# Patient Record
Sex: Female | Born: 1985 | Race: Black or African American | Hispanic: No | Marital: Married | Smoking: Former smoker
Health system: Southern US, Community
[De-identification: ages and names within clinical notes are randomized; demographics above are authoritative.]

## PROBLEM LIST (undated history)

## (undated) ENCOUNTER — Inpatient Hospital Stay (HOSPITAL_COMMUNITY): Payer: Self-pay

## (undated) ENCOUNTER — Inpatient Hospital Stay (HOSPITAL_COMMUNITY): Payer: MEDICAID

## (undated) DIAGNOSIS — Z789 Other specified health status: Secondary | ICD-10-CM

---

## 2012-06-01 ENCOUNTER — Inpatient Hospital Stay (HOSPITAL_COMMUNITY)
Admission: AD | Admit: 2012-06-01 | Discharge: 2012-06-01 | Disposition: A | Discharge status: Home / Self Care | Payer: Medicaid Other | Source: Ambulatory Visit | Attending: Obstetrics & Gynecology | Admitting: Obstetrics & Gynecology

## 2012-06-01 DIAGNOSIS — M545 Low back pain, unspecified: Secondary | ICD-10-CM | POA: Insufficient documentation

## 2012-06-01 DIAGNOSIS — O99891 Other specified diseases and conditions complicating pregnancy: Secondary | ICD-10-CM | POA: Insufficient documentation

## 2012-06-01 DIAGNOSIS — K3189 Other diseases of stomach and duodenum: Secondary | ICD-10-CM | POA: Insufficient documentation

## 2012-06-01 DIAGNOSIS — R1084 Generalized abdominal pain: Secondary | ICD-10-CM

## 2012-06-01 DIAGNOSIS — Z3201 Encounter for pregnancy test, result positive: Secondary | ICD-10-CM

## 2012-06-01 DIAGNOSIS — R1013 Epigastric pain: Secondary | ICD-10-CM | POA: Insufficient documentation

## 2012-06-01 DIAGNOSIS — Z331 Pregnant state, incidental: Secondary | ICD-10-CM

## 2012-06-01 DIAGNOSIS — R109 Unspecified abdominal pain: Secondary | ICD-10-CM | POA: Insufficient documentation

## 2012-06-01 LAB — URINE MICROSCOPIC-ADD ON

## 2012-06-01 LAB — URINALYSIS, ROUTINE W REFLEX MICROSCOPIC
Nitrite: NEGATIVE
Specific Gravity, Urine: 1.015 (ref 1.005–1.030)
Urobilinogen, UA: 0.2 mg/dL (ref 0.0–1.0)
pH: 8 (ref 5.0–8.0)

## 2012-06-01 LAB — POCT PREGNANCY, URINE: Preg Test, Ur: POSITIVE — AB

## 2012-06-01 LAB — WET PREP, GENITAL

## 2012-06-01 NOTE — MAU Note (Signed)
Pt reports having upper abd pain tha comes and goes x 3 weeks.repors back pain as well and headache. No N/V reported.

## 2012-06-01 NOTE — MAU Provider Note (Signed)
History     CSN: [id number]  Arrival date and time: 06/01/12 1113   None     Chief Complaint  Patient presents with  . Abdominal Pain   HPI Alyssa York is 26 y.o. No obstetric history on file. [redacted]w[redacted]d by LMP of 02/21/12 presenting with upper abdominal pain on and off X 1 week.   Reports more burping and gas than usual.  Denies nausea and vomiting.  Also having lower back pain.  Denies vaginal bleeding or abnormal vaginal bleeding. Just moved from [place] thought missed period was from stress.  1 sexual partner X 2 years.  Reports she is not using contraception but she didn't think of pregnancy.   She has an 75 month old at home.     No past medical history on file.  No past surgical history on file.  No family history on file.  History  Substance Use Topics  . Smoking status: Not on file  . Smokeless tobacco: Not on file  . Alcohol Use: Not on file    Allergies: Allergies not on file  No prescriptions prior to admission    Review of Systems  Constitutional: Negative for fever.  Cardiovascular: Claudication: off and on.    Gastrointestinal: Positive for heartburn and abdominal pain (upper). Negative for nausea and vomiting.  Genitourinary:       Negative for bleeding or discharge  Musculoskeletal: Positive for back pain.  Neurological: Positive for headaches.   Physical Exam   Blood pressure 132/65, pulse 81, temperature 98.8 F (37.1 C), temperature source Oral, resp. rate 18, height 5' (1.524 m), weight 74.934 kg (165 lb 3.2 oz), last menstrual period 02/21/2012.  Physical Exam  Constitutional: She is oriented to person, place, and time. She appears well-developed and well-nourished. No distress.  HENT:  Head: Normocephalic.  Neck: Normal range of motion.  Cardiovascular: Normal rate.   Respiratory: Effort normal.  GI: Soft. She exhibits no distension and no mass. There is no tenderness. There is no rebound and no guarding.  Genitourinary: Uterus is  enlarged (fundus 1 cm below umbilicus.  FHR 147). Cervix exhibits no discharge and no friability. No bleeding around the vagina. Vaginal discharge (moderate amount of white frothy discharge without odor) found.  Neurological: She is alert and oriented to person, place, and time.  Skin: Skin is warm and dry.  Psychiatric: She has a normal mood and affect. Her behavior is normal. Thought content normal.   Results for orders placed during the hospital encounter of 06/01/12 (from the past 24 hour(s))  URINALYSIS, ROUTINE W REFLEX MICROSCOPIC     Status: Abnormal   Collection Time   06/01/12 12:00 PM      Component Value Range   Color, Urine YELLOW  YELLOW   APPearance CLEAR  CLEAR   Specific Gravity, Urine 1.015  1.005 - 1.030   pH 8.0  5.0 - 8.0   Glucose, UA NEGATIVE  NEGATIVE mg/dL   Hgb urine dipstick NEGATIVE  NEGATIVE   Bilirubin Urine NEGATIVE  NEGATIVE   Ketones, ur NEGATIVE  NEGATIVE mg/dL   Protein, ur NEGATIVE  NEGATIVE mg/dL   Urobilinogen, UA 0.2  0.0 - 1.0 mg/dL   Nitrite NEGATIVE  NEGATIVE   Leukocytes, UA TRACE (*) NEGATIVE  URINE MICROSCOPIC-ADD ON     Status: Abnormal   Collection Time   06/01/12 12:00 PM      Component Value Range   Squamous Epithelial / LPF FEW (*) RARE   WBC, UA 3-6  <  3 WBC/hpf   RBC / HPF 0-2  <3 RBC/hpf   Bacteria, UA FEW (*) RARE  POCT PREGNANCY, URINE     Status: Abnormal   Collection Time   06/01/12 12:05 PM      Component Value Range   Preg Test, Ur POSITIVE (*) NEGATIVE  WET PREP, GENITAL     Status: Abnormal   Collection Time   06/01/12  2:00 PM      Component Value Range   Yeast Wet Prep HPF POC NONE SEEN  NONE SEEN   Trich, Wet Prep NONE SEEN  NONE SEEN   Clue Cells Wet Prep HPF POC FEW (*) NONE SEEN   WBC, Wet Prep HPF POC FEW (*) NONE SEEN   MAU Course  Procedures  MDM   Assessment and Plan  A:  Indigestion at [redacted] weeks gestation     Lower back pain with negative UA   P:  Patient states she has to leave before labs back  because her husband has to go to work.  [facility]      I will call her with Wet prep results at [phone].   No Rx needed.  Patient informed of lab results     Instructed to begin prenatal vitamins OTC daily and begin prenatal care.  May take tums for indigestion and tylenol for back pain as needed. KEY,EVE M 06/01/2012, 1:36 PM

## 2012-06-02 LAB — GC/CHLAMYDIA PROBE AMP, GENITAL
Chlamydia, DNA Probe: NEGATIVE
GC Probe Amp, Genital: NEGATIVE

## 2012-06-02 NOTE — MAU Provider Note (Signed)
Attestation of Attending Supervision of Advanced Practitioner (CNM/NP): Evaluation and management procedures were performed by the Advanced Practitioner under my supervision and collaboration.  I have reviewed the Advanced Practitioner's note and chart, and I agree with the management and plan.  Kateryn Marasigan, M.D. 06/02/2012 8:05 AM  

## 2012-10-02 ENCOUNTER — Inpatient Hospital Stay (HOSPITAL_COMMUNITY)
Admission: AD | Admit: 2012-10-02 | Payer: Medicaid Other | Source: Ambulatory Visit | Admitting: Obstetrics and Gynecology

## 2013-11-26 NOTE — L&D Delivery Note (Signed)
Delivery Note At 9:01 AM a viable female was delivered via Vaginal, Spontaneous Delivery (Presentation: ; Occiput Posterior).  APGAR: 9, 9; weight TBD.   Placenta status: Intact, Spontaneous.  Cord: 3 vessels with the following complications: None.    Anesthesia: Epidural  Episiotomy: None Lacerations: None Suture Repair: na Est. Blood Loss (mL): 200  Mom to postpartum.  Baby to Couplet care / Skin to Skin.  Pt precipitously delivered without pushing a liveborn female via NSVD with spontaneous cry from direct OP position.   Baby placed on maternal abdomen. I arrived immediately after.  Delayed cord clamping performed.  Cord cut by FOB.  Placenta delivered intact with 3V cord via traction and pitocin.  no tears. No complications.  Mom and baby to postpartum.   Majesty Stehlin L 05/27/2014, 9:22 AM

## 2014-03-11 ENCOUNTER — Inpatient Hospital Stay (HOSPITAL_COMMUNITY)
Admission: AD | Admit: 2014-03-11 | Discharge: 2014-03-11 | Disposition: A | Discharge status: Home / Self Care | Payer: Medicaid Other | Source: Ambulatory Visit | Attending: Obstetrics & Gynecology | Admitting: Obstetrics & Gynecology

## 2014-03-11 ENCOUNTER — Encounter (HOSPITAL_COMMUNITY): Payer: Self-pay | Admitting: *Deleted

## 2014-03-11 DIAGNOSIS — R109 Unspecified abdominal pain: Secondary | ICD-10-CM | POA: Insufficient documentation

## 2014-03-11 DIAGNOSIS — O0933 Supervision of pregnancy with insufficient antenatal care, third trimester: Secondary | ICD-10-CM

## 2014-03-11 DIAGNOSIS — Z87891 Personal history of nicotine dependence: Secondary | ICD-10-CM | POA: Insufficient documentation

## 2014-03-11 DIAGNOSIS — O34219 Maternal care for unspecified type scar from previous cesarean delivery: Secondary | ICD-10-CM | POA: Diagnosis present

## 2014-03-11 DIAGNOSIS — O9989 Other specified diseases and conditions complicating pregnancy, childbirth and the puerperium: Principal | ICD-10-CM

## 2014-03-11 DIAGNOSIS — O99891 Other specified diseases and conditions complicating pregnancy: Secondary | ICD-10-CM | POA: Insufficient documentation

## 2014-03-11 DIAGNOSIS — O0930 Supervision of pregnancy with insufficient antenatal care, unspecified trimester: Secondary | ICD-10-CM | POA: Insufficient documentation

## 2014-03-11 HISTORY — DX: Other specified health status: Z78.9

## 2014-03-11 LAB — URINALYSIS, ROUTINE W REFLEX MICROSCOPIC
BILIRUBIN URINE: NEGATIVE
GLUCOSE, UA: NEGATIVE mg/dL
KETONES UR: NEGATIVE mg/dL
LEUKOCYTES UA: NEGATIVE
Nitrite: NEGATIVE
PH: 7 (ref 5.0–8.0)
Protein, ur: NEGATIVE mg/dL
Specific Gravity, Urine: 1.025 (ref 1.005–1.030)
Urobilinogen, UA: 2 mg/dL — ABNORMAL HIGH (ref 0.0–1.0)

## 2014-03-11 LAB — URINE MICROSCOPIC-ADD ON

## 2014-03-11 NOTE — MAU Provider Note (Signed)
Obstetric Attending MAU Note  Chief Complaint:  Abdominal Pain   HPI: Alyssa York is a 28 y.o. G3P2002 at 226w0d who presents to maternity admissions reporting abdominal pain/contractions last week, no current symptoms.  Has not started prenatal care yet, wants an ultrasound to see her baby.  Denies contractions, leakage of fluid or vaginal bleeding. Good fetal movement.    Patient Active Problem List   Diagnosis Date Noted  . Late prenatal care complicating pregnancy in third trimester 03/11/2014  . Previous cesarean section complicating pregnancy, antepartum 03/11/2014    Past Medical History  Diagnosis Date  . Medical history non-contributory     OB History  Gravida Para Term Preterm AB SAB TAB Ectopic Multiple Living  3 2 2       2     # Outcome Date GA Lbr Len/2nd Weight Sex Delivery Anes PTL Lv  3 CUR           2 TRM           1 TRM               Past Surgical History  Procedure Laterality Date  . Cesarean section      Family History: No family history on file.  Social History: History  Substance Use Topics  . Smoking status: Former Smoker    Quit date: 03/11/2010  . Smokeless tobacco: Not on file  . Alcohol Use: No    Allergies: No Known Allergies  No prescriptions prior to admission    ROS: Pertinent findings in history of present illness.  Physical Exam  Last menstrual period 09/03/2013. GENERAL: Well-developed, well-nourished female in no acute distress.  ABDOMEN: Soft, non-tender, gravid appropriate for gestational age, Fundal height 25 cm EXTREMITIES: Nontender, no edema NEURO: Alert and oriented  FHT:  Baseline 150 , moderate variability, 10 x 10 accelerations present, no decelerations Contractions: none   Labs: Results for orders placed during the hospital encounter of 03/11/14 (from the past 24 hour(s))  URINALYSIS, ROUTINE W REFLEX MICROSCOPIC     Status: Abnormal   Collection Time    03/11/14  1:19 PM      Result Value Ref Range    Color, Urine YELLOW  YELLOW   APPearance CLEAR  CLEAR   Specific Gravity, Urine 1.025  1.005 - 1.030   pH 7.0  5.0 - 8.0   Glucose, UA NEGATIVE  NEGATIVE mg/dL   Hgb urine dipstick TRACE (*) NEGATIVE   Bilirubin Urine NEGATIVE  NEGATIVE   Ketones, ur NEGATIVE  NEGATIVE mg/dL   Protein, ur NEGATIVE  NEGATIVE mg/dL   Urobilinogen, UA 2.0 (*) 0.0 - 1.0 mg/dL   Nitrite NEGATIVE  NEGATIVE   Leukocytes, UA NEGATIVE  NEGATIVE  URINE MICROSCOPIC-ADD ON     Status: Abnormal   Collection Time    03/11/14  1:19 PM      Result Value Ref Range   Squamous Epithelial / LPF MANY (*) RARE   WBC, UA 0-2  <3 WBC/hpf   RBC / HPF 3-6  <3 RBC/hpf   Bacteria, UA MANY (*) RARE   Assessment: 1. Late prenatal care complicating pregnancy in third trimester     Plan: Discharge home Labor precautions and fetal kick counts reviewed Follow up for anatomy ultrasound and OB visit as below      Follow-up Information   [facility]2014. (10 am for New OB appointment, prenatal labs and 1 hr GTT)    Contact  information:   [address] [place] [address] [phone]      [facility] On 03/19/2014. (10:15 am for full ultrasound of the baby. )    Specialty:  Radiology   Contact information:   [address] [id number] [place] [id number] [phone]        Medication List    Notice   You have not been prescribed any medications.      Tereso NewcomerUgonna A Harace Mccluney, MD 03/11/2014 2:39 PM

## 2014-03-11 NOTE — Discharge Instructions (Signed)

## 2014-03-11 NOTE — MAU Note (Signed)
Sharp pains and cramping in lower abd for "wks".. No care yet, waiting on medicaid.  Has not been seen yet.

## 2014-03-11 NOTE — MAU Note (Signed)
pt reports she has cramping pain in the morning and in the evenings but none right now. Has not stared prenatal care yet.

## 2014-03-19 ENCOUNTER — Ambulatory Visit (HOSPITAL_COMMUNITY)
Admit: 2014-03-19 | Discharge: 2014-03-19 | Disposition: A | Discharge status: Home / Self Care | Payer: Medicaid Other | Attending: Obstetrics & Gynecology | Admitting: Obstetrics & Gynecology

## 2014-03-19 ENCOUNTER — Other Ambulatory Visit: Payer: Self-pay | Admitting: Obstetrics & Gynecology

## 2014-03-19 DIAGNOSIS — Z3689 Encounter for other specified antenatal screening: Secondary | ICD-10-CM | POA: Insufficient documentation

## 2014-03-19 DIAGNOSIS — Z3490 Encounter for supervision of normal pregnancy, unspecified, unspecified trimester: Secondary | ICD-10-CM

## 2014-03-20 ENCOUNTER — Encounter: Payer: Self-pay | Admitting: Obstetrics & Gynecology

## 2014-03-25 ENCOUNTER — Encounter: Payer: Medicaid Other | Admitting: Obstetrics & Gynecology

## 2014-03-25 ENCOUNTER — Encounter: Payer: Self-pay | Admitting: Obstetrics & Gynecology

## 2014-05-11 ENCOUNTER — Encounter: Payer: Self-pay | Admitting: General Practice

## 2014-05-22 ENCOUNTER — Encounter: Payer: Self-pay | Admitting: Women's Health

## 2014-05-22 ENCOUNTER — Encounter (HOSPITAL_COMMUNITY): Payer: Self-pay | Admitting: Women's Health

## 2014-05-22 ENCOUNTER — Inpatient Hospital Stay (HOSPITAL_COMMUNITY)
Admission: AD | Admit: 2014-05-22 | Discharge: 2014-05-22 | Disposition: A | Discharge status: Home / Self Care | Payer: 59 | Source: Ambulatory Visit | Attending: Obstetrics & Gynecology | Admitting: Obstetrics & Gynecology

## 2014-05-22 DIAGNOSIS — W010XXA Fall on same level from slipping, tripping and stumbling without subsequent striking against object, initial encounter: Secondary | ICD-10-CM | POA: Insufficient documentation

## 2014-05-22 DIAGNOSIS — N949 Unspecified condition associated with female genital organs and menstrual cycle: Secondary | ICD-10-CM | POA: Insufficient documentation

## 2014-05-22 DIAGNOSIS — O34219 Maternal care for unspecified type scar from previous cesarean delivery: Secondary | ICD-10-CM

## 2014-05-22 DIAGNOSIS — M7918 Myalgia, other site: Secondary | ICD-10-CM

## 2014-05-22 DIAGNOSIS — Z87891 Personal history of nicotine dependence: Secondary | ICD-10-CM | POA: Insufficient documentation

## 2014-05-22 DIAGNOSIS — O99891 Other specified diseases and conditions complicating pregnancy: Secondary | ICD-10-CM | POA: Insufficient documentation

## 2014-05-22 DIAGNOSIS — O9989 Other specified diseases and conditions complicating pregnancy, childbirth and the puerperium: Principal | ICD-10-CM

## 2014-05-22 DIAGNOSIS — O0933 Supervision of pregnancy with insufficient antenatal care, third trimester: Secondary | ICD-10-CM

## 2014-05-22 DIAGNOSIS — Y92009 Unspecified place in unspecified non-institutional (private) residence as the place of occurrence of the external cause: Secondary | ICD-10-CM | POA: Insufficient documentation

## 2014-05-22 LAB — RAPID URINE DRUG SCREEN, HOSP PERFORMED
Amphetamines: NOT DETECTED
BARBITURATES: NOT DETECTED
Benzodiazepines: NOT DETECTED
COCAINE: NOT DETECTED
Opiates: NOT DETECTED
Tetrahydrocannabinol: NOT DETECTED

## 2014-05-22 LAB — URINALYSIS, ROUTINE W REFLEX MICROSCOPIC
BILIRUBIN URINE: NEGATIVE
Glucose, UA: NEGATIVE mg/dL
Hgb urine dipstick: NEGATIVE
KETONES UR: 40 mg/dL — AB
Leukocytes, UA: NEGATIVE
NITRITE: NEGATIVE
Protein, ur: NEGATIVE mg/dL
SPECIFIC GRAVITY, URINE: 1.015 (ref 1.005–1.030)
UROBILINOGEN UA: 2 mg/dL — AB (ref 0.0–1.0)
pH: 7 (ref 5.0–8.0)

## 2014-05-22 LAB — OB RESULTS CONSOLE GC/CHLAMYDIA
CHLAMYDIA, DNA PROBE: NEGATIVE
GC PROBE AMP, GENITAL: NEGATIVE

## 2014-05-22 LAB — DIFFERENTIAL
Basophils Absolute: 0 10*3/uL (ref 0.0–0.1)
Basophils Relative: 0 % (ref 0–1)
Eosinophils Absolute: 0 10*3/uL (ref 0.0–0.7)
Eosinophils Relative: 0 % (ref 0–5)
LYMPHS ABS: 1.1 10*3/uL (ref 0.7–4.0)
LYMPHS PCT: 15 % (ref 12–46)
Monocytes Absolute: 0.7 10*3/uL (ref 0.1–1.0)
Monocytes Relative: 9 % (ref 3–12)
NEUTROS ABS: 5.5 10*3/uL (ref 1.7–7.7)
NEUTROS PCT: 76 % (ref 43–77)

## 2014-05-22 LAB — RAPID HIV SCREEN (WH-MAU): SUDS RAPID HIV SCREEN: NONREACTIVE

## 2014-05-22 LAB — TYPE AND SCREEN
ABO/RH(D): B POS
ANTIBODY SCREEN: NEGATIVE

## 2014-05-22 LAB — WET PREP, GENITAL
TRICH WET PREP: NONE SEEN
YEAST WET PREP: NONE SEEN

## 2014-05-22 LAB — CBC
HCT: 34.7 % — ABNORMAL LOW (ref 36.0–46.0)
Hemoglobin: 11.7 g/dL — ABNORMAL LOW (ref 12.0–15.0)
MCH: 29 pg (ref 26.0–34.0)
MCHC: 33.7 g/dL (ref 30.0–36.0)
MCV: 86.1 fL (ref 78.0–100.0)
PLATELETS: 195 10*3/uL (ref 150–400)
RBC: 4.03 MIL/uL (ref 3.87–5.11)
RDW: 13.3 % (ref 11.5–15.5)
WBC: 7.4 10*3/uL (ref 4.0–10.5)

## 2014-05-22 LAB — GROUP B STREP BY PCR: GROUP B STREP BY PCR: POSITIVE — AB

## 2014-05-22 LAB — HEPATITIS B SURFACE ANTIGEN: Hepatitis B Surface Ag: NEGATIVE

## 2014-05-22 LAB — ABO/RH: ABO/RH(D): B POS

## 2014-05-22 LAB — RPR

## 2014-05-22 LAB — GLUCOSE TOLERANCE, 1 HOUR: GLUCOSE 1 HOUR GTT: 122 mg/dL (ref 70–140)

## 2014-05-22 MED ORDER — ACETAMINOPHEN 325 MG PO TABS
650.0000 mg | ORAL_TABLET | ORAL | Status: DC | PRN
  Administered 2014-05-22: 650 mg via ORAL
  Filled 2014-05-22: qty 2

## 2014-05-22 NOTE — MAU Provider Note (Signed)

## 2014-05-22 NOTE — MAU Note (Signed)
Pt states here for pelvic pain. No PNC with exception of [facility]. Partner states pt was in kitchen and slipped, did not hit her abdomen, however has had increased pain in pelvis and has had contractions since then.

## 2014-05-22 NOTE — MAU Provider Note (Signed)
History  Chief Complaint:  Pelvic Pain  Alyssa York is a 28 y.o. G61P2002 female at 108w2d by LMP c/w 27.5wk u/s, presenting w/ report of slipping on water in kitchen last night and falling w/ legs spread, hitting mons area of pelvis and reporting pain in that area now.  Has not tried anything for pain relief. Has not had any pnc, had visit here in April- was scheduled for outpatient anatomy u/s which was completed and revealed normal female w/ dating c/w LMP. She did not show up for new ob appt. States she never applied for her [place].  Reports active fetal movement, contractions: irregular for 'awhile', vaginal bleeding: none, membranes: intact. Denies uti s/s, abnormal/malodorous vag d/c or vulvovaginal itching/irritation.   Pregnancy complicated by no pnc, h/o c/s w/ 1st pregnancy d/t nrfhr, had successful vbac w/ 2nd pregnancy, desires vbac.  Obstetrical History: OB History   Grav Para Term Preterm Abortions TAB SAB Ect Mult Living   3 2 2       2       Past Medical History: Past Medical History  Diagnosis Date  . Medical history non-contributory     Past Surgical History: Past Surgical History  Procedure Laterality Date  . Cesarean section      Social History: History   Social History  . Marital Status: Married    Spouse Name: N/A    Number of Children: N/A  . Years of Education: N/A   Social History Main Topics  . Smoking status: Former Smoker    Quit date: 03/11/2010  . Smokeless tobacco: None  . Alcohol Use: No  . Drug Use: No  . Sexual Activity: Yes   Other Topics Concern  . None   Social History Narrative  . None    Allergies: No Known Allergies  Prescriptions prior to admission  Medication Sig Dispense Refill  . Prenatal Vit-Fe Fumarate-FA (PRENATAL MULTIVITAMIN) TABS tablet Take 1 tablet by mouth daily at 12 noon.        Review of Systems  Pertinent pos/neg as indicated in HPI  Physical Exam  Blood pressure 99/61, pulse 106, temperature  98.2 F (36.8 C), resp. rate 18, height 5' (1.524 m), weight 79.379 kg (175 lb), last menstrual period 09/03/2013. General appearance: alert, cooperative and no distress Lungs: clear to auscultation bilaterally, normal effort Heart: regular rate and rhythm Abdomen: gravid, soft, non-tender Extremities: No edema DTR's 2+ + tenderness mons pubis/symphisis pubis- no obvious signs of trauma/bruising Spec exam: cx visually slightly open, small amt creamy white nonodorous d/c Cultures/Specimens: gc/ct, wet prep, gbs  Dilation: 2.5 Effacement (%): 50 Station: -2 Presentation: Vertex Exam by:: Joellyn Haff CNM Presentation: cephalic  Fetal monitoring: FHR: 125 bpm, variability: moderate,  Accelerations: Present,  decelerations:  Absent Uterine activity: ui  MAU Course  Spec exam w/ gc/ct, wet prep, gbs SVE OB panel w/ 1hr glucola UA, UDS, urine cx APAP  Labs:  Results for orders placed during the hospital encounter of 05/22/14 (from the past 24 hour(s))  URINALYSIS, ROUTINE W REFLEX MICROSCOPIC     Status: Abnormal   Collection Time    05/22/14  4:14 PM      Result Value Ref Range   Color, Urine YELLOW  YELLOW   APPearance CLEAR  CLEAR   Specific Gravity, Urine 1.015  1.005 - 1.030   pH 7.0  5.0 - 8.0   Glucose, UA NEGATIVE  NEGATIVE mg/dL   Hgb urine dipstick NEGATIVE  NEGATIVE   Bilirubin Urine NEGATIVE  NEGATIVE   Ketones, ur 40 (*) NEGATIVE mg/dL   Protein, ur NEGATIVE  NEGATIVE mg/dL   Urobilinogen, UA 2.0 (*) 0.0 - 1.0 mg/dL   Nitrite NEGATIVE  NEGATIVE   Leukocytes, UA NEGATIVE  NEGATIVE  URINE RAPID DRUG SCREEN (HOSP PERFORMED)     Status: None   Collection Time    05/22/14  4:14 PM      Result Value Ref Range   Opiates NONE DETECTED  NONE DETECTED   Cocaine NONE DETECTED  NONE DETECTED   Benzodiazepines NONE DETECTED  NONE DETECTED   Amphetamines NONE DETECTED  NONE DETECTED   Tetrahydrocannabinol NONE DETECTED  NONE DETECTED   Barbiturates NONE DETECTED  NONE  DETECTED  GROUP B STREP BY PCR     Status: Abnormal   Collection Time    05/22/14  4:45 PM      Result Value Ref Range   Group B strep by PCR POSITIVE (*) NEGATIVE  WET PREP, GENITAL     Status: Abnormal   Collection Time    05/22/14  4:45 PM      Result Value Ref Range   Yeast Wet Prep HPF POC NONE SEEN  NONE SEEN   Trich, Wet Prep NONE SEEN  NONE SEEN   Clue Cells Wet Prep HPF POC MODERATE (*) NONE SEEN   WBC, Wet Prep HPF POC FEW (*) NONE SEEN  CBC     Status: Abnormal   Collection Time    05/22/14  5:14 PM      Result Value Ref Range   WBC 7.4  4.0 - 10.5 K/uL   RBC 4.03  3.87 - 5.11 MIL/uL   Hemoglobin 11.7 (*) 12.0 - 15.0 g/dL   HCT 29.934.7 (*) 37.136.0 - 69.646.0 %   MCV 86.1  78.0 - 100.0 fL   MCH 29.0  26.0 - 34.0 pg   MCHC 33.7  30.0 - 36.0 g/dL   RDW 78.913.3  38.111.5 - 01.715.5 %   Platelets 195  150 - 400 K/uL  DIFFERENTIAL     Status: None   Collection Time    05/22/14  5:14 PM      Result Value Ref Range   Neutrophils Relative % 76  43 - 77 %   Neutro Abs 5.5  1.7 - 7.7 K/uL   Lymphocytes Relative 15  12 - 46 %   Lymphs Abs 1.1  0.7 - 4.0 K/uL   Monocytes Relative 9  3 - 12 %   Monocytes Absolute 0.7  0.1 - 1.0 K/uL   Eosinophils Relative 0  0 - 5 %   Eosinophils Absolute 0.0  0.0 - 0.7 K/uL   Basophils Relative 0  0 - 1 %   Basophils Absolute 0.0  0.0 - 0.1 K/uL  TYPE AND SCREEN     Status: None   Collection Time    05/22/14  5:14 PM      Result Value Ref Range   ABO/RH(D) B POS     Antibody Screen NEG     Sample Expiration 05/25/2014    RAPID HIV SCREEN ([facility])     Status: None   Collection Time    05/22/14  5:14 PM      Result Value Ref Range   SUDS Rapid HIV Screen NON REACTIVE  NON REACTIVE    Imaging:  n/a  Assessment and Plan  A:  2067w2d SIUP  G3P2002  Symphysis pubis/mons tenderness s/p fall   Cat 1 FHR  GBS+  Mod clues on wet prep, asymptomatic, will not tx  No pnc, ob panel, 1hr glucola, gbs, gc/ct, urine cx done at this visit  H/O prev c/s for  nrfhr w/ subsequent successful vbac, desires another vbac P:  D/C home  Reviewed labor s/s, fkc  APAP, warm baths, pregnancy/maternity belt for symphysis pubis/mons discomfort  VBAC consent signed, will scan into chart  Will send note to clinic to contact pt Monday for new ob visit   Marge DuncansBooker, Kimberly Randall CNM,WHNP-BC 6/27/20156:28 PM

## 2014-05-22 NOTE — Discharge Instructions (Signed)
[facility] if:  You begin to have strong, frequent contractions  Your water breaks.  Sometimes it is a big gush of fluid, sometimes it is just a trickle that keeps getting your panties wet or running down your legs  You have vaginal bleeding.  It is normal to have a small amount of spotting if your cervix was checked.   You don't feel your baby moving like normal.  If you don't, get you something to eat and drink and lay down and focus on feeling your baby move.  You should feel at least 10 movements in 2 hours.  If you don't, you should call the clinic or [facility].    [facility] Cont[facility] Contractions of the uterus can occur throughout pregnancy. Contractions are not always a sign that you are in labor.  WHAT ARE BRAXTON HICKS CONTRACTIONS?  Contractions that occur before labor are called Braxton Hicks contractions, or false labor. Toward the end of pregnancy (32-34 weeks), these contractions can develop more often and may become more forceful. This is not true labor because these contractions do not result in opening (dilatation) and thinning of the cervix. They are sometimes difficult to tell apart from true labor because these contractions can be forceful and people have different pain tolerances. You should not feel embarrassed if you go to the hospital with false labor. Sometimes, the only way to tell if you are in true labor is for your health care provider to look for changes in the cervix. If there are no prenatal problems or other health problems associated with the pregnancy, it is completely safe to be sent home with false labor and await the onset of true labor. HOW CAN YOU TELL THE DIFFERENCE BETWEEN TRUE AND FALSE LABOR? False Labor  The contractions of false labor are usually shorter and not as hard as those of true labor.   The contractions are usually irregular.   The contractions are often felt in the front of the lower abdomen and in the  groin.   The contractions may go away when you walk around or change positions while lying down.   The contractions get weaker and are shorter lasting as time goes on.   The contractions do not usually become progressively stronger, regular, and closer together as with true labor.  True Labor  Contractions in true labor last 30-70 seconds, become very regular, usually become more intense, and increase in frequency.   The contractions do not go away with walking.   The discomfort is usually felt in the top of the uterus and spreads to the lower abdomen and low back.   True labor can be determined by your health care provider with an exam. This will show that the cervix is dilating and getting thinner.  WHAT TO REMEMBER  Keep up with your usual exercises and follow other instructions given by your health care provider.   Take medicines as directed by your health care provider.   Keep your regular prenatal appointments.   Eat and drink lightly if you think you are going into labor.   If Braxton Hicks contractions are making you uncomfortable:   Change your position from lying down or resting to walking, or from walking to resting.   Sit and rest in a tub of warm water.   Drink 2-3 glasses of water. Dehydration may cause these contractions.   Do slow and deep breathing several times an hour.  WHEN SHOULD I SEEK  IMMEDIATE MEDICAL CARE? Seek immediate medical care if:  Your contractions become stronger, more regular, and closer together.   You have fluid leaking or gushing from your vagina.   You have a fever.   You pass blood-tinged mucus.   You have vaginal bleeding.   You have continuous abdominal pain.   You have low back pain that you never had before.   You feel your baby's head pushing down and causing pelvic pressure.   Your baby is not moving as much as it used to.  Document Released: 11/12/2005 Document Revised: 11/17/2013 Document  Reviewed: 08/24/2013 [facility] Patient Information 2015 [place]. This information is not intended to replace advice given to you by your health care provider. Make sure you discuss any questions you have with your health care provider. Fetal Movement Counts Patient Name: __________________________________________________ Patient Due Date: ____________________ Performing a fetal movement count is highly recommended in high-risk pregnancies, but it is good for every pregnant woman to do. Your caregiver may ask you to start counting fetal movements at 28 weeks of the pregnancy. Fetal movements often increase:  After eating a full meal.  After physical activity.  After eating or drinking something sweet or cold.  At rest. Pay attention to when you feel the baby is most active. This will help you notice a pattern of your baby's sleep and wake cycles and what factors contribute to an increase in fetal movement. It is important to perform a fetal movement count at the same time each day when your baby is normally most active.  HOW TO COUNT FETAL MOVEMENTS 1. Find a quiet and comfortable area to sit or lie down on your left side. Lying on your left side provides the best blood and oxygen circulation to your baby. 2. Write down the day and time on a sheet of paper or in a journal. 3. Start counting kicks, flutters, swishes, rolls, or jabs in a 2 hour period. You should feel at least 10 movements within 2 hours. 4. If you do not feel 10 movements in 2 hours, wait 2-3 hours and count again. Look for a change in the pattern or not enough counts in 2 hours. SEEK MEDICAL CARE IF:  You feel less than 10 counts in 2 hours, tried twice.  There is no movement in over an hour.  The pattern is changing or taking longer each day to reach 10 counts in 2 hours.  You feel the baby is not moving as he or she usually does. Date: ____________ Movements: ____________ Start time: ____________ Alyssa MartinFinish time:  ____________  Date: ____________ Movements: ____________ Start time: ____________ Alyssa MartinFinish time: ____________ Date: ____________ Movements: ____________ Start time: ____________ Alyssa MartinFinish time: ____________ Date: ____________ Movements: ____________ Start time: ____________ Alyssa MartinFinish time: ____________ Date: ____________ Movements: ____________ Start time: ____________ Alyssa MartinFinish time: ____________ Date: ____________ Movements: ____________ Start time: ____________ Alyssa MartinFinish time: ____________ Date: ____________ Movements: ____________ Start time: ____________ Alyssa MartinFinish time: ____________ Date: ____________ Movements: ____________ Start time: ____________ Alyssa MartinFinish time: ____________  Date: ____________ Movements: ____________ Start time: ____________ Alyssa MartinFinish time: ____________ Date: ____________ Movements: ____________ Start time: ____________ Alyssa MartinFinish time: ____________ Date: ____________ Movements: ____________ Start time: ____________ Alyssa MartinFinish time: ____________ Date: ____________ Movements: ____________ Start time: ____________ Alyssa MartinFinish time: ____________ Date: ____________ Movements: ____________ Start time: ____________ Alyssa MartinFinish time: ____________ Date: ____________ Movements: ____________ Start time: ____________ Alyssa MartinFinish time: ____________ Date: ____________ Movements: ____________ Start time: ____________ Alyssa MartinFinish time: ____________  Date: ____________ Movements: ____________ Start time: ____________ Alyssa MartinFinish time: ____________ Date: ____________ Movements: ____________  Start time: ____________ Alyssa York time: ____________ Date: ____________ Movements: ____________ Start time: ____________ Alyssa York time: ____________ Date: ____________ Movements: ____________ Start time: ____________ Alyssa York time: ____________ Date: ____________ Movements: ____________ Start time: ____________ Alyssa York time: ____________ Date: ____________ Movements: ____________ Start time: ____________ Alyssa York time: ____________ Date: ____________ Movements:  ____________ Start time: ____________ Alyssa York time: ____________  Date: ____________ Movements: ____________ Start time: ____________ Alyssa York time: ____________ Date: ____________ Movements: ____________ Start time: ____________ Alyssa York time: ____________ Date: ____________ Movements: ____________ Start time: ____________ Alyssa York time: ____________ Date: ____________ Movements: ____________ Start time: ____________ Alyssa York time: ____________ Date: ____________ Movements: ____________ Start time: ____________ Alyssa York time: ____________ Date: ____________ Movements: ____________ Start time: ____________ Alyssa York time: ____________ Date: ____________ Movements: ____________ Start time: ____________ Alyssa York time: ____________  Date: ____________ Movements: ____________ Start time: ____________ Alyssa York time: ____________ Date: ____________ Movements: ____________ Start time: ____________ Alyssa York time: ____________ Date: ____________ Movements: ____________ Start time: ____________ Alyssa York time: ____________ Date: ____________ Movements: ____________ Start time: ____________ Alyssa York time: ____________ Date: ____________ Movements: ____________ Start time: ____________ Alyssa York time: ____________ Date: ____________ Movements: ____________ Start time: ____________ Alyssa York time: ____________ Date: ____________ Movements: ____________ Start time: ____________ Alyssa York time: ____________  Date: ____________ Movements: ____________ Start time: ____________ Alyssa York time: ____________ Date: ____________ Movements: ____________ Start time: ____________ Alyssa York time: ____________ Date: ____________ Movements: ____________ Start time: ____________ Alyssa York time: ____________ Date: ____________ Movements: ____________ Start time: ____________ Alyssa York time: ____________ Date: ____________ Movements: ____________ Start time: ____________ Alyssa York time: ____________ Date: ____________ Movements: ____________ Start time: ____________ Alyssa York  time: ____________ Date: ____________ Movements: ____________ Start time: ____________ Alyssa York time: ____________  Date: ____________ Movements: ____________ Start time: ____________ Alyssa York time: ____________ Date: ____________ Movements: ____________ Start time: ____________ Alyssa York time: ____________ Date: ____________ Movements: ____________ Start time: ____________ Alyssa York time: ____________ Date: ____________ Movements: ____________ Start time: ____________ Alyssa York time: ____________ Date: ____________ Movements: ____________ Start time: ____________ Alyssa York time: ____________ Date: ____________ Movements: ____________ Start time: ____________ Alyssa York time: ____________ Date: ____________ Movements: ____________ Start time: ____________ Alyssa York time: ____________  Date: ____________ Movements: ____________ Start time: ____________ Alyssa York time: ____________ Date: ____________ Movements: ____________ Start time: ____________ Alyssa York time: ____________ Date: ____________ Movements: ____________ Start time: ____________ Alyssa York time: ____________ Date: ____________ Movements: ____________ Start time: ____________ Alyssa York time: ____________ Date: ____________ Movements: ____________ Start time: ____________ Alyssa York time: ____________ Date: ____________ Movements: ____________ Start time: ____________ Alyssa York time: ____________ Document Released: 12/12/2006 Document Revised: 10/29/2012 Document Reviewed: 09/08/2012 ExitCare Patient Information 2015 [place], [employer]. This information is not intended to replace advice given to you by your health care provider. Make sure you discuss any questions you have with your health care provider.

## 2014-05-23 LAB — CULTURE, OB URINE
Colony Count: >=100000
Special Requests: NORMAL

## 2014-05-24 ENCOUNTER — Telehealth: Payer: Self-pay | Admitting: Medical

## 2014-05-24 ENCOUNTER — Telehealth: Payer: Self-pay | Admitting: Obstetrics and Gynecology

## 2014-05-24 DIAGNOSIS — O2343 Unspecified infection of urinary tract in pregnancy, third trimester: Secondary | ICD-10-CM

## 2014-05-24 LAB — GC/CHLAMYDIA PROBE AMP
CT Probe RNA: NEGATIVE
GC Probe RNA: NEGATIVE

## 2014-05-24 MED ORDER — AMOXICILLIN 500 MG PO CAPS: 500.0000 mg | ORAL_CAPSULE | Freq: Three times a day (TID) | ORAL | Status: DC

## 2014-05-24 NOTE — Telephone Encounter (Signed)
Attempted to contact patient with results of urine culture. +UTI with GBS. Rx sent to patient's pharmacy. No answer and no voicemail at only contact number listed.   Freddi StarrJulie N Ethier, PA-C 05/24/2014 4:30 PM

## 2014-05-24 NOTE — Telephone Encounter (Signed)
Called patient to inform her of her appointment. No answer, and no way to leave a voicemail.

## 2014-05-25 ENCOUNTER — Encounter: Payer: Medicaid Other | Admitting: Obstetrics and Gynecology

## 2014-05-25 LAB — RUBELLA SCREEN: Rubella: 1.79 Index — ABNORMAL HIGH (ref <0.90)

## 2014-05-26 ENCOUNTER — Encounter (HOSPITAL_COMMUNITY): Payer: Self-pay | Admitting: *Deleted

## 2014-05-26 ENCOUNTER — Encounter (HOSPITAL_COMMUNITY): Payer: 59 | Admitting: Anesthesiology

## 2014-05-26 ENCOUNTER — Inpatient Hospital Stay (HOSPITAL_COMMUNITY)
Admission: AD | Admit: 2014-05-26 | Discharge: 2014-05-28 | DRG: 775 | Disposition: A | Discharge status: Home / Self Care | Payer: 59 | Source: Ambulatory Visit | Attending: Obstetrics & Gynecology | Admitting: Obstetrics & Gynecology

## 2014-05-26 ENCOUNTER — Inpatient Hospital Stay (HOSPITAL_COMMUNITY): Payer: 59 | Admitting: Anesthesiology

## 2014-05-26 DIAGNOSIS — O99892 Other specified diseases and conditions complicating childbirth: Secondary | ICD-10-CM | POA: Diagnosis present

## 2014-05-26 DIAGNOSIS — O34219 Maternal care for unspecified type scar from previous cesarean delivery: Secondary | ICD-10-CM | POA: Diagnosis present

## 2014-05-26 DIAGNOSIS — O0933 Supervision of pregnancy with insufficient antenatal care, third trimester: Secondary | ICD-10-CM

## 2014-05-26 DIAGNOSIS — Z87891 Personal history of nicotine dependence: Secondary | ICD-10-CM

## 2014-05-26 DIAGNOSIS — O4290 Premature rupture of membranes, unspecified as to length of time between rupture and onset of labor, unspecified weeks of gestation: Secondary | ICD-10-CM | POA: Diagnosis present

## 2014-05-26 DIAGNOSIS — O4202 Full-term premature rupture of membranes, onset of labor within 24 hours of rupture: Secondary | ICD-10-CM

## 2014-05-26 DIAGNOSIS — O0930 Supervision of pregnancy with insufficient antenatal care, unspecified trimester: Secondary | ICD-10-CM

## 2014-05-26 DIAGNOSIS — O411090 Infection of amniotic sac and membranes, unspecified, unspecified trimester, not applicable or unspecified: Principal | ICD-10-CM | POA: Diagnosis present

## 2014-05-26 DIAGNOSIS — Z22330 Carrier of Group B streptococcus: Secondary | ICD-10-CM

## 2014-05-26 DIAGNOSIS — O9989 Other specified diseases and conditions complicating pregnancy, childbirth and the puerperium: Secondary | ICD-10-CM

## 2014-05-26 DIAGNOSIS — Z3490 Encounter for supervision of normal pregnancy, unspecified, unspecified trimester: Secondary | ICD-10-CM

## 2014-05-26 LAB — RAPID URINE DRUG SCREEN, HOSP PERFORMED
Amphetamines: NOT DETECTED
BARBITURATES: NOT DETECTED
BENZODIAZEPINES: NOT DETECTED
Cocaine: NOT DETECTED
Opiates: NOT DETECTED
TETRAHYDROCANNABINOL: NOT DETECTED

## 2014-05-26 LAB — URINE MICROSCOPIC-ADD ON

## 2014-05-26 LAB — URINALYSIS, ROUTINE W REFLEX MICROSCOPIC
Bilirubin Urine: NEGATIVE
Glucose, UA: NEGATIVE mg/dL
Ketones, ur: 15 mg/dL — AB
NITRITE: NEGATIVE
PH: 6.5 (ref 5.0–8.0)
Protein, ur: NEGATIVE mg/dL
SPECIFIC GRAVITY, URINE: 1.02 (ref 1.005–1.030)
UROBILINOGEN UA: 2 mg/dL — AB (ref 0.0–1.0)

## 2014-05-26 LAB — CBC
HCT: 36.3 % (ref 36.0–46.0)
Hemoglobin: 12.4 g/dL (ref 12.0–15.0)
MCH: 29.6 pg (ref 26.0–34.0)
MCHC: 34.2 g/dL (ref 30.0–36.0)
MCV: 86.6 fL (ref 78.0–100.0)
PLATELETS: 194 10*3/uL (ref 150–400)
RBC: 4.19 MIL/uL (ref 3.87–5.11)
RDW: 13.6 % (ref 11.5–15.5)
WBC: 11.2 10*3/uL — ABNORMAL HIGH (ref 4.0–10.5)

## 2014-05-26 LAB — TYPE AND SCREEN
ABO/RH(D): B POS
Antibody Screen: NEGATIVE

## 2014-05-26 LAB — OB RESULTS CONSOLE GBS: STREP GROUP B AG: POSITIVE

## 2014-05-26 LAB — OB RESULTS CONSOLE HIV ANTIBODY (ROUTINE TESTING): HIV: NONREACTIVE

## 2014-05-26 MED ORDER — OXYTOCIN 40 UNITS IN LACTATED RINGERS INFUSION - SIMPLE MED: 62.5000 mL/h | INTRAVENOUS | Status: DC

## 2014-05-26 MED ORDER — LIDOCAINE HCL (PF) 1 % IJ SOLN
INTRAMUSCULAR | Status: DC | PRN
  Administered 2014-05-26: 10 mL

## 2014-05-26 MED ORDER — FENTANYL 2.5 MCG/ML BUPIVACAINE 1/10 % EPIDURAL INFUSION (WH - ANES): 14.0000 mL/h | INTRAMUSCULAR | Status: DC | PRN

## 2014-05-26 MED ORDER — LACTATED RINGERS IV SOLN
500.0000 mL | INTRAVENOUS | Status: DC | PRN
  Administered 2014-05-27: 300 mL via INTRAVENOUS

## 2014-05-26 MED ORDER — TERBUTALINE SULFATE 1 MG/ML IJ SOLN: 0.2500 mg | Freq: Once | INTRAMUSCULAR | Status: AC | PRN

## 2014-05-26 MED ORDER — PENICILLIN G POTASSIUM 5000000 UNITS IJ SOLR
5.0000 10*6.[IU] | Freq: Once | INTRAVENOUS | Status: AC
  Administered 2014-05-26: 5 10*6.[IU] via INTRAVENOUS
  Filled 2014-05-26: qty 5

## 2014-05-26 MED ORDER — EPHEDRINE 5 MG/ML INJ
10.0000 mg | INTRAVENOUS | Status: DC | PRN
  Filled 2014-05-26: qty 2

## 2014-05-26 MED ORDER — LIDOCAINE HCL (PF) 1 % IJ SOLN
30.0000 mL | INTRAMUSCULAR | Status: DC | PRN
  Filled 2014-05-26: qty 30

## 2014-05-26 MED ORDER — OXYTOCIN 40 UNITS IN LACTATED RINGERS INFUSION - SIMPLE MED
1.0000 m[IU]/min | INTRAVENOUS | Status: DC
  Administered 2014-05-26: 2 m[IU]/min via INTRAVENOUS
  Filled 2014-05-26: qty 1000

## 2014-05-26 MED ORDER — EPHEDRINE 5 MG/ML INJ
10.0000 mg | INTRAVENOUS | Status: DC | PRN
  Filled 2014-05-26: qty 4
  Filled 2014-05-26: qty 2

## 2014-05-26 MED ORDER — ONDANSETRON HCL 4 MG/2ML IJ SOLN
4.0000 mg | Freq: Four times a day (QID) | INTRAMUSCULAR | Status: DC | PRN
Start: 2014-05-26 — End: 2014-05-27

## 2014-05-26 MED ORDER — OXYTOCIN BOLUS FROM INFUSION
500.0000 mL | INTRAVENOUS | Status: DC
  Administered 2014-05-27: 500 mL via INTRAVENOUS

## 2014-05-26 MED ORDER — LACTATED RINGERS IV SOLN
INTRAVENOUS | Status: DC
  Administered 2014-05-26 (×2): via INTRAVENOUS

## 2014-05-26 MED ORDER — ACETAMINOPHEN 325 MG PO TABS
650.0000 mg | ORAL_TABLET | ORAL | Status: DC | PRN
  Administered 2014-05-27: 650 mg via ORAL
  Filled 2014-05-26: qty 2

## 2014-05-26 MED ORDER — PHENYLEPHRINE 40 MCG/ML (10ML) SYRINGE FOR IV PUSH (FOR BLOOD PRESSURE SUPPORT)
80.0000 ug | PREFILLED_SYRINGE | INTRAVENOUS | Status: DC | PRN
  Filled 2014-05-26: qty 10
  Filled 2014-05-26: qty 2

## 2014-05-26 MED ORDER — PHENYLEPHRINE 40 MCG/ML (10ML) SYRINGE FOR IV PUSH (FOR BLOOD PRESSURE SUPPORT)
80.0000 ug | PREFILLED_SYRINGE | INTRAVENOUS | Status: DC | PRN
  Filled 2014-05-26: qty 2

## 2014-05-26 MED ORDER — LACTATED RINGERS IV SOLN
500.0000 mL | Freq: Once | INTRAVENOUS | Status: AC
  Administered 2014-05-26: 500 mL via INTRAVENOUS

## 2014-05-26 MED ORDER — DIPHENHYDRAMINE HCL 50 MG/ML IJ SOLN: 12.5000 mg | INTRAMUSCULAR | Status: DC | PRN

## 2014-05-26 MED ORDER — PENICILLIN G POTASSIUM 5000000 UNITS IJ SOLR
2.5000 10*6.[IU] | INTRAVENOUS | Status: DC
  Administered 2014-05-26 – 2014-05-27 (×3): 2.5 10*6.[IU] via INTRAVENOUS
  Filled 2014-05-26 (×5): qty 2.5

## 2014-05-26 MED ORDER — IBUPROFEN 600 MG PO TABS
600.0000 mg | ORAL_TABLET | Freq: Four times a day (QID) | ORAL | Status: DC | PRN
  Administered 2014-05-27: 600 mg via ORAL
  Filled 2014-05-26: qty 1

## 2014-05-26 MED ORDER — OXYCODONE-ACETAMINOPHEN 5-325 MG PO TABS: 1.0000 | ORAL_TABLET | ORAL | Status: DC | PRN

## 2014-05-26 MED ORDER — FENTANYL 2.5 MCG/ML BUPIVACAINE 1/10 % EPIDURAL INFUSION (WH - ANES)
14.0000 mL/h | INTRAMUSCULAR | Status: DC | PRN
  Administered 2014-05-26 – 2014-05-27 (×2): 14 mL/h via EPIDURAL
  Filled 2014-05-26 (×2): qty 125

## 2014-05-26 MED ORDER — CITRIC ACID-SODIUM CITRATE 334-500 MG/5ML PO SOLN
30.0000 mL | ORAL | Status: DC | PRN
  Filled 2014-05-26: qty 15

## 2014-05-26 NOTE — Progress Notes (Signed)
Alyssa York is a 28 y.o. [phone] with IUP at 6965w6d presenting with rupture of membranes  Subjective: S/p placement of epidural, feeling ok No complaints of pain or pressure   Objective: BP 116/71  Pulse 86  Temp(Src) 98.4 F (36.9 C) (Oral)  Resp 18  Ht 5' 1.5" (1.562 m)  Wt 79.833 kg (176 lb)  BMI 32.72 kg/m2  SpO2 100%  LMP 09/03/2013      FHT:  FHR: 135 bpm, variability: moderate,  accelerations:  Present,  decelerations:  Present variable UC:   irregular, every 2-5 minutes SVE:   Dilation: 4 Effacement (%): 50 Station: Ballotable Exam by:: J.Thornton, RN  Labs: Lab Results  Component Value Date   WBC 11.2* 05/26/2014   HGB 12.4 05/26/2014   HCT 36.3 05/26/2014   MCV 86.6 05/26/2014   PLT 194 05/26/2014    Assessment / Plan: Spontaneous labor, progressing normally; pit @ 10mU  Labor: Progressing on Pitocin Preeclampsia:  no signs or symptoms of toxicity Fetal Wellbeing:  Category II Pain Control:  Epidural I/D:  on PCN Anticipated MOD:  NSVD TOLAC (s/p 1 previous successful vaginal delivery) Will need SW consult [facility]  Anselm LisMarsh, Jadie Comas 05/26/2014, 9:38 PM

## 2014-05-26 NOTE — Anesthesia Preprocedure Evaluation (Signed)

## 2014-05-26 NOTE — Anesthesia Procedure Notes (Signed)
Epidural Patient location during procedure: OB  Preanesthetic Checklist Completed: patient identified, site marked, surgical consent, pre-op evaluation, timeout performed, IV checked, risks and benefits discussed and monitors and equipment checked  Epidural Patient position: sitting Prep: site prepped and draped and DuraPrep Patient monitoring: continuous pulse ox and blood pressure Approach: midline Injection technique: LOR air  Needle:  Needle type: Tuohy  Needle gauge: 17 G Needle length: 9 cm and 9 Needle insertion depth: 6 cm Catheter type: closed end flexible Catheter size: 19 Gauge Catheter at skin depth: 12 cm Test dose: negative  Assessment Events: blood not aspirated, injection not painful, no injection resistance, negative IV test and no paresthesia  Additional Notes 1st LOR at 5cm, with (+) heme asp; removed with replacement at same site as above  Dosing of Epidural:  1st dose, through catheter ............................................Marland Kitchen.  Xylocaine 40 mg  2nd dose, through catheter, after waiting 3 minutes........Marland Kitchen.Xylocaine 60 mg    ( 1% Xylo charted as a single dose in Epic Meds for ease of charting; actual dosing was fractionated as above, for saftey's sake)  As each dose occurred, patient was free of IV sx; and patient exhibited no evidence of SA injection.  Patient is more comfortable after epidural dosed. Please see RN's note for documentation of vital signs,and FHR which are stable.  Patient reminded not to try to ambulate with numb legs, and that an RN must be present when she attempts to get up.

## 2014-05-26 NOTE — MAU Provider Note (Signed)
Alyssa York is a 28 y.o. female [id number] with IUP at 7729w6d presenting with rupture of membranes. Pt states she has been having regular, every 3 hours contractions, associated with scant vaginal bleeding/ staining.  Membranes are ruptured confirmed by fern test, with active fetal movement.   No PNCare   She reports fetal movement, leakage of fluid x 1 day (last night) with some pink staining on her underwear.  She has minimal pain. She states that she has contractions approximately q3hrs.   Prenatal History/Complications: Patient is 6637 w 6d  With hx of C-section with her first pregnancy. She performed successful TOLAC with her second pregnancy. This pregnancy has progressed without any complications but no prenatal care. She has been seen in the MAU previously during her pregnancy, but has failed to follow up with appointments for prenatal care.  Past Medical History: Past Medical History  Diagnosis Date  . Medical history non-contributory     Past Surgical History: Past Surgical History  Procedure Laterality Date  . Cesarean section      Obstetrical History: OB History   Grav Para Term Preterm Abortions TAB SAB Ect Mult Living   3 2 2       2       Gynecological History: OB History   Grav Para Term Preterm Abortions TAB SAB Ect Mult Living   3 2 2       2       Social History: History   Social History  . Marital Status: Married    Spouse Name: N/A    Number of Children: N/A  . Years of Education: N/A   Social History Main Topics  . Smoking status: Former Smoker    Quit date: 03/11/2010  . Smokeless tobacco: None  . Alcohol Use: No  . Drug Use: No  . Sexual Activity: Yes    Birth Control/ Protection: None   Other Topics Concern  . None   Social History Narrative  . None    Family History: History reviewed. No pertinent family history.  Allergies: No Known Allergies  Prescriptions prior to admission  Medication Sig Dispense Refill  . acetaminophen  (TYLENOL) 325 MG tablet Take 650 mg by mouth every 6 (six) hours as needed for moderate pain.      . Prenatal Vit-Fe Fumarate-FA (PRENATAL MULTIVITAMIN) TABS tablet Take 1 tablet by mouth daily at 12 noon.         Review of Systems   Constitutional: No complaints. Pertinent ROS per HPI above.   Blood pressure 125/71, pulse 84, temperature 98.8 F (37.1 C), temperature source Oral, resp. rate 16, height 5' 1.5" (1.562 m), weight 79.833 kg (176 lb), last menstrual period 09/03/2013, SpO2 99.00%. General appearance: alert, cooperative and no distress Lungs: clear to auscultation bilaterally Heart: regular rate and rhythm Abdomen: soft, non-tender; bowel sounds normal, size and fundal height appropriate for gestational age.  Pelvic: positive pooling upon speculum examination.  Extremities: Homans sign is negative, no sign of DVT Neurological: grossly intact Presentation: cephalic Fetal monitoring: Baseline 135BPM, Moderate Variability, Accelerations, Overall Reassuring Cat I Uterine activityFrequency: Every 3-5 minutes Dilation: 3 Effacement (%): 40 Station: -3 Exam by:: Dr. Maggie Fontdum (confirmed by utrasound at bedside)   Prenatal labs: ABO, Rh: --/--/B POS (06/27 1715) Antibody: NEG (06/27 1714) Rubella:   RPR: NON REAC (06/27 1714)  HBsAg: NEGATIVE (06/27 1714)  HIV:    GBS:    Negative 1 hr Glucola - Negative Genetic screening - N/A Anatomy US - F  Prenatal Transfer Tool  Maternal Diabetes: No Genetic Screening: Declined Maternal Ultrasounds/Referrals: Declined Fetal Ultrasounds or other Referrals:  None Maternal Substance Abuse:  No Significant Maternal Medications:  None Significant Maternal Lab Results: None     Results for orders placed during the hospital encounter of 05/26/14 (from the past 24 hour(s))  URINALYSIS, ROUTINE W REFLEX MICROSCOPIC   Collection Time    05/26/14  2:00 PM      Result Value Ref Range   Color, Urine YELLOW  YELLOW   APPearance CLOUDY  (*) CLEAR   Specific Gravity, Urine 1.020  1.005 - 1.030   pH 6.5  5.0 - 8.0   Glucose, UA NEGATIVE  NEGATIVE mg/dL   Hgb urine dipstick TRACE (*) NEGATIVE   Bilirubin Urine NEGATIVE  NEGATIVE   Ketones, ur 15 (*) NEGATIVE mg/dL   Protein, ur NEGATIVE  NEGATIVE mg/dL   Urobilinogen, UA 2.0 (*) 0.0 - 1.0 mg/dL   Nitrite NEGATIVE  NEGATIVE   Leukocytes, UA MODERATE (*) NEGATIVE  URINE MICROSCOPIC-ADD ON   Collection Time    05/26/14  2:00 PM      Result Value Ref Range   Squamous Epithelial / LPF MANY (*) RARE   WBC, UA 21-50  <3 WBC/hpf   Bacteria, UA FEW (*) RARE   Urine-Other MUCOUS PRESENT      Assessment: Alyssa York is a 28 y.o. [id number] with hx of LTCS and TOLAC at 6757w6d by L=28 week scan here for rupture of membranes.  #Labor: Spontaneous vaginal delivery expected, will induce with Pitocin #Pain: Epidural and fent PRN #FWB:  Category 1 Tracing #ID: GBS+ - Start PCN #MOF: Breastfeeding #MOC:Tubal Ligation interval - needs consent signed #Circ:  N/A  Thanks for letting us take care of you!  [place], Hillery HunterCaleb G 05/26/2014, 3:56 PM  I spoke with and examined patient and agree with resident's note and plan of care.  Tawana ScaleMichael Ryan Naquan Garman, MD OB Fellow 05/26/2014 6:08 PM

## 2014-05-26 NOTE — MAU Note (Addendum)
Patient states she has had no prenatal care. States she started having clear vaginal discharge last night and has been pink to red today. Having about  5 contractions every hour. Reports good fetal movement.

## 2014-05-27 ENCOUNTER — Encounter (HOSPITAL_COMMUNITY): Payer: Self-pay

## 2014-05-27 DIAGNOSIS — O411090 Infection of amniotic sac and membranes, unspecified, unspecified trimester, not applicable or unspecified: Secondary | ICD-10-CM

## 2014-05-27 DIAGNOSIS — O9989 Other specified diseases and conditions complicating pregnancy, childbirth and the puerperium: Secondary | ICD-10-CM

## 2014-05-27 DIAGNOSIS — Z3490 Encounter for supervision of normal pregnancy, unspecified, unspecified trimester: Secondary | ICD-10-CM

## 2014-05-27 DIAGNOSIS — O34219 Maternal care for unspecified type scar from previous cesarean delivery: Secondary | ICD-10-CM

## 2014-05-27 DIAGNOSIS — O99892 Other specified diseases and conditions complicating childbirth: Secondary | ICD-10-CM

## 2014-05-27 LAB — RPR

## 2014-05-27 MED ORDER — TETANUS-DIPHTH-ACELL PERTUSSIS 5-2.5-18.5 LF-MCG/0.5 IM SUSP: 0.5000 mL | Freq: Once | INTRAMUSCULAR | Status: DC

## 2014-05-27 MED ORDER — ONDANSETRON HCL 4 MG/2ML IJ SOLN: 4.0000 mg | INTRAMUSCULAR | Status: DC | PRN

## 2014-05-27 MED ORDER — LANOLIN HYDROUS EX OINT: TOPICAL_OINTMENT | CUTANEOUS | Status: DC | PRN

## 2014-05-27 MED ORDER — SODIUM CHLORIDE 0.9 % IV SOLN
2.0000 g | Freq: Four times a day (QID) | INTRAVENOUS | Status: DC
  Administered 2014-05-27: 2 g via INTRAVENOUS
  Filled 2014-05-27 (×2): qty 2000

## 2014-05-27 MED ORDER — OXYCODONE-ACETAMINOPHEN 5-325 MG PO TABS
1.0000 | ORAL_TABLET | ORAL | Status: DC | PRN
  Administered 2014-05-27 – 2014-05-28 (×5): 1 via ORAL
  Filled 2014-05-27 (×5): qty 1

## 2014-05-27 MED ORDER — ZOLPIDEM TARTRATE 5 MG PO TABS: 5.0000 mg | ORAL_TABLET | Freq: Every evening | ORAL | Status: DC | PRN

## 2014-05-27 MED ORDER — DIBUCAINE 1 % RE OINT: 1.0000 "application " | TOPICAL_OINTMENT | RECTAL | Status: DC | PRN

## 2014-05-27 MED ORDER — ONDANSETRON HCL 4 MG PO TABS: 4.0000 mg | ORAL_TABLET | ORAL | Status: DC | PRN

## 2014-05-27 MED ORDER — SENNOSIDES-DOCUSATE SODIUM 8.6-50 MG PO TABS
2.0000 | ORAL_TABLET | ORAL | Status: DC
  Administered 2014-05-27: 2 via ORAL
  Filled 2014-05-27: qty 2

## 2014-05-27 MED ORDER — WITCH HAZEL-GLYCERIN EX PADS: 1.0000 "application " | MEDICATED_PAD | CUTANEOUS | Status: DC | PRN

## 2014-05-27 MED ORDER — DEXTROSE 5 % IV SOLN
160.0000 mg | Freq: Once | INTRAVENOUS | Status: DC
  Filled 2014-05-27: qty 4

## 2014-05-27 MED ORDER — GENTAMICIN SULFATE 40 MG/ML IJ SOLN
150.0000 mg | Freq: Three times a day (TID) | INTRAVENOUS | Status: DC
  Filled 2014-05-27: qty 3.75

## 2014-05-27 MED ORDER — IBUPROFEN 600 MG PO TABS
600.0000 mg | ORAL_TABLET | Freq: Four times a day (QID) | ORAL | Status: DC
  Administered 2014-05-27 – 2014-05-28 (×4): 600 mg via ORAL
  Filled 2014-05-27 (×4): qty 1

## 2014-05-27 MED ORDER — PRENATAL MULTIVITAMIN CH
1.0000 | ORAL_TABLET | Freq: Every day | ORAL | Status: DC
  Administered 2014-05-27 – 2014-05-28 (×2): 1 via ORAL
  Filled 2014-05-27 (×2): qty 1

## 2014-05-27 MED ORDER — MEASLES, MUMPS & RUBELLA VAC ~~LOC~~ INJ
0.5000 mL | INJECTION | Freq: Once | SUBCUTANEOUS | Status: DC
  Filled 2014-05-27: qty 0.5

## 2014-05-27 MED ORDER — SIMETHICONE 80 MG PO CHEW: 80.0000 mg | CHEWABLE_TABLET | ORAL | Status: DC | PRN

## 2014-05-27 MED ORDER — DIPHENHYDRAMINE HCL 25 MG PO CAPS: 25.0000 mg | ORAL_CAPSULE | Freq: Four times a day (QID) | ORAL | Status: DC | PRN

## 2014-05-27 MED ORDER — BENZOCAINE-MENTHOL 20-0.5 % EX AERO
1.0000 | INHALATION_SPRAY | CUTANEOUS | Status: DC | PRN
Start: 2014-05-27 — End: 2014-05-29
  Filled 2014-05-27: qty 56

## 2014-05-27 NOTE — Progress Notes (Signed)
I examined pt and agree with documentation above and resident plan of care. Antonio Woodhams N Muhammad, CNM  

## 2014-05-27 NOTE — Progress Notes (Signed)
Alyssa York is a 28 y.o. [phone] with IUP at 3747w6d presenting with rupture of membranes  Subjective: Feeling a little more pressure Otherwise no complaints Has been sleeping intermittently   Objective: BP 113/68  Pulse 79  Temp(Src) 98.2 F (36.8 C) (Oral)  Resp 18  Ht 5' 1.5" (1.562 m)  Wt 79.833 kg (176 lb)  BMI 32.72 kg/m2  SpO2 100%  LMP 09/03/2013   Total I/O In: -  Out: 600 [Urine:600]  FHT:  FHR: 135 bpm, variability: moderate,  accelerations:  Present,  decelerations: early UC:   irregular, every 2-6 minutes SVE:   Dilation: 5.5 Effacement (%): 60;70 Station: -2 Exam by:: Dr Michail JewelsMarsh  Labs: Lab Results  Component Value Date   WBC 11.2* 05/26/2014   HGB 12.4 05/26/2014   HCT 36.3 05/26/2014   MCV 86.6 05/26/2014   PLT 194 05/26/2014    Assessment / Plan: Spontaneous labor, progressing normally; pit @ 14mU  Labor: Progressing slowly on Pitocin, will increase per protocol Preeclampsia:  no signs or symptoms of toxicity Fetal Wellbeing:  Category I Pain Control:  Epidural I/D:  on PCN Anticipated MOD:  NSVD TOLAC (s/p 1 previous successful vaginal delivery) Will need SW consult for [facility]  Anselm LisMarsh, Oluwatoni Rotunno 05/27/2014, 2:02 AM

## 2014-05-27 NOTE — Anesthesia Postprocedure Evaluation (Signed)
Anesthesia Post Note  Patient: Alyssa York  Procedure(s) Performed: * No procedures listed *  Anesthesia type: Epidural  Patient location: Mother/Baby  Post pain: Pain level controlled  Post assessment: Post-op Vital signs reviewed  Last Vitals:  Filed Vitals:   05/27/14 1200  BP: 128/89  Pulse: 89  Temp: 37.4 C  Resp: 18    Post vital signs: Reviewed  Level of consciousness:alert  Complications: No apparent anesthesia complications

## 2014-05-27 NOTE — Progress Notes (Signed)
ANTIBIOTIC CONSULT NOTE - INITIAL  Pharmacy Consult for Gentamicin Indication: Chorioamnionitis   No Known Allergies  Patient Measurements: Height: 5' 1.5" (156.2 cm) Weight: 176 lb (79.833 kg) IBW/kg (Calculated) : 48.95 Adjusted Body Weight: 28.3  Vital Signs: Temp: 99.3 F (37.4 C) (07/02 0830) Temp src: Axillary (07/02 0830) BP: 114/71 mmHg (07/02 0830) Pulse Rate: 97 (07/02 0830)  Labs:  Recent Labs  05/26/14 1650  WBC 11.2*  HGB 12.4  PLT 194   No results found for this basename: GENTTROUGH, GENTPEAK, GENTRANDOM,  in the last 72 hours   Microbiology: Recent Results (from the past 720 hour(s))  CULTURE, OB URINE     Status: None   Collection Time    05/22/14  4:14 PM      Result Value Ref Range Status   Specimen Description OB CLEAN CATCH   Final   Special Requests Normal   Final   Culture  Setup Time     Final   Value: 05/22/2014 23:24     Performed at [facility]   Colony Count     Final   Value: >=100,000 COLONIES/ML     [facility]formed at [facility]   Culture     Final   Value: GROUP B STREP(S.AGALACTIAE)ISOLATED     Note: TESTING AGAINST S. AGALACTIAE NOT ROUTINELY PERFORMED DUE TO PREDICTABILITY OF AMP/PEN/VAN SUSCEPTIBILITY.     Performed at [facility]   Report Status 05/23/2014 FINAL   Final  GROUP B STREP BY PCR     Status: Abnormal   Collection Time    05/22/14  4:45 PM      Result Value Ref Range Status   Group B strep by PCR POSITIVE (*) NEGATIVE Final   Comment: CRITICAL RESULT CALLED TO, READ BACK BY AND VERIFIED WITH:     GINGER MORRIS RN.@1805  ON 6.27.15 BY TCALDWELL MT  WET PREP, GENITAL     Status: Abnormal   Collection Time    05/22/14  4:45 PM      Result Value Ref Range Status   Yeast Wet Prep HPF POC NONE SEEN  NONE SEEN Final   Trich, Wet Prep NONE SEEN  NONE SEEN Final   Clue Cells Wet Prep HPF POC MODERATE (*) NONE SEEN Final   WBC, Wet Prep HPF POC FEW (*) NONE SEEN Final   Comment: MODERATE  BACTERIA SEEN  GC/CHLAMYDIA PROBE AMP     Status: None   Collection Time    05/22/14  4:45 PM      Result Value Ref Range Status   CT Probe RNA NEGATIVE  NEGATIVE Final   GC Probe RNA NEGATIVE  NEGATIVE Final   Comment: (NOTE)                                                                                               **Normal Reference Range: Negative**          Assay performed using the Gen-Probe APTIMA COMBO2 (R) Assay.     Acceptable specimen types for this assay include APTIMA Swabs (Unisex,     endocervical, urethral, or  vaginal), first void urine, and ThinPrep     liquid based cytology samples.     Performed at [facility]  OB RESULTS CONSOLE GBS     Status: None   Collection Time    05/26/14  5:50 PM      Result Value Ref Range Status   GBS Positive   Final    Medications:  Ampicillin 2 grams  Assessment: 28 y.o. female G3P2002 at 7833w0d with Hx c-section. Limited PNC, GBS positive with suspected chorioamnionitis.  Estimated Ke = 0.286, Vd = 0.39 L/kg  Goal of Therapy:  Gentamicin peak 6-8 mg/L and Trough < 1 mg/L  Plan:  Gentamicin 160 mg IV x 1  Gentamicin 150 mg IV every 8 hrs  Check Scr with next labs if gentamicin continued. Will check gentamicin levels if continued > 72hr or clinically indicated.  Maika Mcelveen Scarlett 05/27/2014,8:43 AM

## 2014-05-27 NOTE — H&P (Signed)
Alyssa York is a 28 y.o. female 353P2002 with IUP at 1958w6d presenting with rupture of membranes. Pt states she has been having regular, every 3 hours contractions, associated with scant vaginal bleeding/ staining. Membranes are ruptured confirmed by fern test, with active fetal movement.   No PNCare   She reports fetal movement, leakage of fluid x 1 day (last night) with some pink staining on her underwear. She has minimal pain. She states that she has contractions approximately q3hrs.   Prenatal History/Complications:  Patient is 6137 w 6d With hx of C-section with her first pregnancy. She performed successful TOLAC with her second pregnancy. This pregnancy has progressed without any complications but no prenatal care. She has been seen in the MAU previously during her pregnancy, but has failed to follow up with appointments for prenatal care.   Past Medical History:  Past Medical History   Diagnosis  Date   .  Medical history non-contributory     Past Surgical History:  Past Surgical History   Procedure  Laterality  Date   .  Cesarean section      Obstetrical History:  OB History    Grav  Para  Term  Preterm  Abortions  TAB  SAB  Ect  Mult  Living    3  2  2        2       Gynecological History:  OB History    Grav  Para  Term  Preterm  Abortions  TAB  SAB  Ect  Mult  Living    3  2  2        2       Social History:  History    Social History   .  Marital Status:  Married     Spouse Name:  N/A     Number of Children:  N/A   .  Years of Education:  N/A    Social History Main Topics   .  Smoking status:  Former Smoker     Quit date:  03/11/2010   .  Smokeless tobacco:  None   .  Alcohol Use:  No   .  Drug Use:  No   .  Sexual Activity:  Yes     Birth Control/ Protection:  None    Other Topics  Concern   .  None    Social History Narrative   .  None    Family History:  History reviewed. No pertinent family history.   Allergies:  No Known Allergies   Prescriptions prior to admission   Medication  Sig  Dispense  Refill   .  acetaminophen (TYLENOL) 325 MG tablet  Take 650 mg by mouth every 6 (six) hours as needed for moderate pain.     .  Prenatal Vit-Fe Fumarate-FA (PRENATAL MULTIVITAMIN) TABS tablet  Take 1 tablet by mouth daily at 12 noon.      Review of Systems  Constitutional: No complaints. Pertinent ROS per HPI above.  Physical Exam  Blood pressure 125/71, pulse 84, temperature 98.8 F (37.1 C), temperature source Oral, resp. rate 16, height 5' 1.5" (1.562 m), weight 79.833 kg (176 lb), last menstrual period 09/03/2013, SpO2 99.00%.  General appearance: alert, cooperative and no distress  Lungs: clear to auscultation bilaterally  Heart: regular rate and rhythm  Abdomen: soft, non-tender; bowel sounds normal, size and fundal height appropriate for gestational age.  Pelvic: positive pooling upon speculum examination.  Extremities: Homans sign is negative,  no sign of DVT  Neurological: grossly intact  Presentation: cephalic  Fetal monitoring: Baseline 135BPM, Moderate Variability, Accelerations, Overall Reassuring Cat I  Uterine activityFrequency: Every 3-5 minutes  Dilation: 3  Effacement (%): 40  Station: -3  Exam by:: Dr. Maggie Fontdum (confirmed by utrasound at bedside)   Prenatal labs:  ABO, Rh: --/--/B POS (06/27 1715)  Antibody: NEG (06/27 1714)  Rubella:  RPR: NON REAC (06/27 1714)  HBsAg: NEGATIVE (06/27 1714)  HIV:  GBS: Positive  1 hr Glucola - Negative  Genetic screening - N/A   Prenatal Transfer Tool   Maternal Diabetes: No  Genetic Screening: Declined  Maternal Ultrasounds/Referrals: Declined  Fetal Ultrasounds or other Referrals: Normal anatomy Maternal Substance Abuse: No  Significant Maternal Medications: None  Significant Maternal Lab Results: GBS positive  Results for orders placed during the hospital encounter of 05/26/14 (from the past 24 hour(s))   URINALYSIS, ROUTINE W REFLEX MICROSCOPIC     Collection Time    05/26/14 2:00 PM   Result  Value  Ref Range    Color, Urine  YELLOW  YELLOW    APPearance  CLOUDY (*)  CLEAR    Specific Gravity, Urine  1.020  1.005 - 1.030    pH  6.5  5.0 - 8.0    Glucose, UA  NEGATIVE  NEGATIVE mg/dL    Hgb urine dipstick  TRACE (*)  NEGATIVE    Bilirubin Urine  NEGATIVE  NEGATIVE    Ketones, ur  15 (*)  NEGATIVE mg/dL    Protein, ur  NEGATIVE  NEGATIVE mg/dL    Urobilinogen, UA  2.0 (*)  0.0 - 1.0 mg/dL    Nitrite  NEGATIVE  NEGATIVE    Leukocytes, UA  MODERATE (*)  NEGATIVE   URINE MICROSCOPIC-ADD ON    Collection Time    05/26/14 2:00 PM   Result  Value  Ref Range    Squamous Epithelial / LPF  MANY (*)  RARE    WBC, UA  21-50  <3 WBC/hpf    Bacteria, UA  FEW (*)  RARE    Urine-Other  MUCOUS PRESENT     Assessment:  Alyssa York is a 28 y.o. F6O1308G3P2002 with hx of LTCS and TOLAC at 4377w6d by L=28 week scan here for rupture of membranes.  #Labor: Spontaneous vaginal delivery expected, will induce with Pitocin  #Pain: Epidural and fent PRN  #FWB: Category 1 Tracing  #ID: GBS+ - Start PCN  #MOF: Breastfeeding  #MOC:Tubal Ligation interval - needs consent signed  #Circ: N/A   Melancon, Caleb G  05/26/2014, 3:56 PM   I spoke with and examined patient and agree with resident's note and plan of care.  Tawana ScaleMichael Ryan Odom, MD OB Fellow  05/26/2014  6:08 PM

## 2014-05-27 NOTE — Progress Notes (Signed)
  Subjective: Pr reports comfortable with epidural.  Mild headache.    Objective: BP 116/75  Pulse 81  Temp(Src) 98.2 F (36.8 C) (Oral)  Resp 18  Ht 5' 1.5" (1.562 m)  Wt 79.833 kg (176 lb)  BMI 32.72 kg/m2  SpO2 100%  LMP 09/03/2013   Total I/O In: -  Out: 600 [Urine:600]  FHT:  FHR: 130's bpm, variability: moderate,  accelerations:  Present,  decelerations:  Present intermittent variables. UC:   Poor tracing; IUPC placed. SVE:   Dilation: 5.5 Effacement (%): 60;70 Station: -2 Exam by:: Dr Michail JewelsMarsh  Labs: Lab Results  Component Value Date   WBC 11.2* 05/26/2014   HGB 12.4 05/26/2014   HCT 36.3 05/26/2014   MCV 86.6 05/26/2014   PLT 194 05/26/2014    Assessment / Plan: Augmentation of labor, slow progression  Labor: Augmentation of labor, slow progression Preeclampsia:  n/a Fetal Wellbeing:  Category I Pain Control:  Epidural I/D:  GBS pos Anticipated MOD:  NSVD  [facility] Hospital[facility]UHAMMAD,Alyssa Perfetti 05/27/2014, 5:07 AM

## 2014-05-27 NOTE — Progress Notes (Signed)
Admission nutrition screen triggered. Patients chart reviewed and assessed  for nutritional risk. Patient is determined to be at low nutrition  risk.   Alyssa York M.Alyssa LusterEd. R.D. LDN Neonatal Nutrition Support Specialist/RD III Pager [phone]

## 2014-05-27 NOTE — Lactation Note (Signed)
This note was copied from the chart of Alyssa Rubie MaidQuante Bigford. Lactation Consultation Note  Patient Name: Alyssa York WUJWJ'XToday's Date: 05/27/2014 Reason for consult: Initial assessment Baby 7 hours of life. Mom reports that breastfeeding is going well. Mom states she BF both of her older children, 1 week each. Mom's plan is to BF this child for about a week. Mom asks about nursing and offering bottles. Enc mom to exclusively BF as this is recommended as best, but her choice as to how she will feed. Discussed benefits of nursing, and risks of formula. Mom states she has some discomfort at beginning of nursing. Discussed that this is probably baby stretching her nipple out if lasts for seconds at beginning of BF, but enc to call out for assistance if discomfort persists. Mom given [facility] brochure, aware of OP/BFSG and community resources.  Maternal Data Has patient been taught Hand Expression?: Yes Does the patient have breastfeeding experience prior to this delivery?: Yes  Feeding    LATCH Score/Interventions          Comfort (Breast/Nipple):  (Mom states she is having some mild discomfort at the beginning of BF.)           Lactation Tools Discussed/Used     Consult Status Consult Status: Follow-up Date: 05/28/14 Follow-up type: In-patient    Geralynn OchsWILLIARD, Peterson Mathey 05/27/2014, 4:36 PM

## 2014-05-27 NOTE — MAU Provider Note (Signed)
Attestation of Attending Supervision of Advanced Practitioner (PA/CNM/NP): Evaluation and management procedures were performed by the Advanced Practitioner under my supervision and collaboration.  I have reviewed the Advanced Practitioner's note and chart, and I agree with the management and plan.  Reva BoresPRATT,Quavis Klutz S, MD [facility] Attending 05/27/2014 12:55 AM

## 2014-05-27 NOTE — Progress Notes (Signed)
I examined pt and agree with documentation above and resident plan of care. Conchita Truxillo N Muhammad, CNM  

## 2014-05-28 MED ORDER — TERBINAFINE HCL 1 % EX CREA: TOPICAL_CREAM | Freq: Two times a day (BID) | CUTANEOUS | Status: DC

## 2014-05-28 MED ORDER — CLOTRIMAZOLE 1 % EX CREA
1.0000 "application " | TOPICAL_CREAM | Freq: Two times a day (BID) | CUTANEOUS | Status: DC
  Administered 2014-05-28: 1 via TOPICAL
  Filled 2014-05-28 (×2): qty 14.17

## 2014-05-28 MED ORDER — IBUPROFEN 600 MG PO TABS: 600.0000 mg | ORAL_TABLET | Freq: Four times a day (QID) | ORAL | Status: DC

## 2014-05-28 MED ORDER — CLOTRIMAZOLE 1 % EX CREA: 1.0000 "application " | TOPICAL_CREAM | Freq: Two times a day (BID) | CUTANEOUS | Status: DC

## 2014-05-28 NOTE — Plan of Care (Signed)
Problem: Discharge Progression Outcomes Goal: Barriers To Progression Addressed/Resolved Outcome: Completed/Met Date Met:  05/28/14 Patient has skin rash on Left anterior forearm. MD ordered cream for home use

## 2014-05-28 NOTE — Discharge Summary (Signed)
Obstetric Discharge Summary Reason for Admission: rupture of membranes Prenatal Procedures: NST Intrapartum Procedures: spontaneous vaginal delivery and VBAC Postpartum Procedures: none Complications-Operative and Postpartum: none Hemoglobin  Date Value Ref Range Status  05/26/2014 12.4  12.0 - 15.0 g/dL Final     HCT  Date Value Ref Range Status  05/26/2014 36.3  36.0 - 46.0 % Final  Hospital Course: Alyssa York is a [age] y.o. female 383P2002 with IUP at 6234w6d presenting with rupture of membranes. Pt states she has been having regular, every 3 hours contractions, associated with scant vaginal bleeding/ staining. Membranes are ruptured confirmed by fern test, with active fetal movement.  No PNCare  Delivery Note  At 9:01 AM a viable female was delivered via Vaginal, Spontaneous Delivery (Presentation: ; Occiput Posterior). APGAR: 9, 9; weight TBD.  Placenta status: Intact, Spontaneous. Cord: 3 vessels with the following complications: None.  Anesthesia: Epidural  Episiotomy: None  Lacerations: None  Suture Repair: na  Est. Blood Loss (mL): 200  Mom to postpartum. Baby to Couplet care / Skin to Skin.  Pt precipitously delivered without pushing a liveborn female via NSVD with spontaneous cry from direct OP position. Baby placed on maternal abdomen. I arrived immediately after. Delayed cord clamping performed. Cord cut by FOB. Placenta delivered intact with 3V cord via traction and pitocin. no tears. No complications. Mom and baby to postpartum.  BECK, KELI L  05/27/2014, 9:22 AM   Has done well postpartum Requests early discharge. Wants interval BTL/salpingectomy   Physical Exam:  General: alert, cooperative and no distress Lochia: appropriate Uterine Fundus: firm Incision: healing well DVT Evaluation: No evidence of DVT seen on physical exam.  Discharge Diagnoses: Term Pregnancy-delivered                                          VBAC Discharge Information: Date:  05/28/2014 Activity: unrestricted and pelvic rest Diet: routine Medications: PNV and Ibuprofen Condition: stable and improved Instructions: refer to practice specific booklet Discharge to: home   Newborn Data: Live born female  Birth Weight: 6 lb 4.5 oz (2850 g) APGAR: 9, 9  Home with mother.  Alyssa York,Alyssa York 05/28/2014, 7:22 AM

## 2014-05-28 NOTE — Discharge Summary (Signed)
Attestation of Attending Supervision of Advanced Practitioner (PA/CNM/NP): Evaluation and management procedures were performed by the Advanced Practitioner under my supervision and collaboration.  I have reviewed the Advanced Practitioner's note and chart, and I agree with the management and plan.  Deseree Zemaitis, MD, FACOG Attending Obstetrician & Gynecologist Faculty Practice, [facility]   

## 2014-05-28 NOTE — Plan of Care (Signed)
Problem: Discharge Progression Outcomes Goal: Other Discharge Outcomes/Goals Baby is made a patient for continued observation of feedings and condition. Social work consult will be completed, before d/c of infant, secondary to no prenatal care by mother

## 2014-05-28 NOTE — Progress Notes (Signed)
Phone call to Dr. Ike Benedom for clarification of discharge status on patient. New medication, Lotimin cream and indication for Social work consult due to no prenatal care, need resolution on discharge summary.

## 2014-05-28 NOTE — Discharge Instructions (Signed)
Vaginal Delivery °Care After °Refer to this sheet in the next few weeks. These discharge instructions provide you with information on caring for yourself after delivery. Your caregiver may also give you specific instructions. Your treatment has been planned according to the most current medical practices available, but problems sometimes occur. Call your caregiver if you have any problems or questions after you go home. °HOME CARE INSTRUCTIONS °· Take over-the-counter or prescription medicines only as directed by your caregiver or pharmacist. °· Do not drink alcohol, especially if you are breastfeeding or taking medicine to relieve pain. °· Do not chew or smoke tobacco. °· Do not use illegal drugs. °· Continue to use good perineal care. Good perineal care includes: °¨ Wiping your perineum from front to back. °¨ Keeping your perineum clean. °· Do not use tampons or douche until your caregiver says it is okay. °· Shower, wash your hair, and take tub baths as directed by your caregiver. °· Wear a well-fitting bra that provides breast support. °· Eat healthy foods. °· Drink enough fluids to keep your urine clear or pale yellow. °· Eat high-fiber foods such as whole grain cereals and breads, brown rice, beans, and fresh fruits and vegetables every day. These foods may help prevent or relieve constipation. °· Follow your cargiver's recommendations regarding resumption of activities such as climbing stairs, driving, lifting, exercising, or traveling. °· Talk to your caregiver about resuming sexual activities. Resumption of sexual activities is dependent upon your risk of infection, your rate of healing, and your comfort and desire to resume sexual activity. °· Try to have someone help you with your household activities and your newborn for at least a few days after you leave the hospital. °· Rest as much as possible. Try to rest or take a nap when your newborn is sleeping. °· Increase your activities gradually. °· Keep all  of your scheduled postpartum appointments. It is very important to keep your scheduled follow-up appointments. At these appointments, your caregiver will be checking to make sure that you are healing physically and emotionally. °SEEK MEDICAL CARE IF:  °· You are passing large clots from your vagina. Save any clots to show your caregiver. °· You have a foul smelling discharge from your vagina. °· You have trouble urinating. °· You are urinating frequently. °· You have pain when you urinate. °· You have a change in your bowel movements. °· You have increasing redness, pain, or swelling near your vaginal incision (episiotomy) or vaginal tear. °· You have pus draining from your episiotomy or vaginal tear. °· Your episiotomy or vaginal tear is separating. °· You have painful, hard, or reddened breasts. °· You have a severe headache. °· You have blurred vision or see spots. °· You feel sad or depressed. °· You have thoughts of hurting yourself or your newborn. °· You have questions about your care, the care of your newborn, or medicines. °· You are dizzy or lightheaded. °· You have a rash. °· You have nausea or vomiting. °· You were breastfeeding and have not had a menstrual period within 12 weeks after you stopped breastfeeding. °· You are not breastfeeding and have not had a menstrual period by the 12th week after delivery. °· You have a fever. °SEEK IMMEDIATE MEDICAL CARE IF:  °· You have persistent pain. °· You have chest pain. °· You have shortness of breath. °· You faint. °· You have leg pain. °· You have stomach pain. °· Your vaginal bleeding saturates two or more sanitary pads   in 1 hour. °MAKE SURE YOU:  °· Understand these instructions. °· Will watch your condition. °· Will get help right away if you are not doing well or get worse. ° ° °Document Released: 11/09/2000 Document Revised: 08/06/2012 Document Reviewed: 07/09/2012 °Exit[employer]® Patient Information ©2015 [employer]. This information is not intended to  replace advice given to you by your health care provider. Make sure you discuss any questions you have with your health care provider. ° °

## 2014-05-28 NOTE — Progress Notes (Signed)
CSW attempted to meet with [facility]OB to [facility], but she was an early discharge and FOB states she has left to "run errands."  FOB was here with his 2 sons (ages 2 and 1) and baby.  He appears to be appropriately caring for the children and states they are doing well.  CSW will leave report for weekend CSW to attempt to complete assessment prior to baby's discharge.  MOB and baby's UDSs were negative.  CSW will monitor MDS result.

## 2014-05-28 NOTE — Progress Notes (Signed)
Prior to d/c pt complains of itching on fore arm.  Raised localized plaque to forearm. Pt thinks may be ringworm Tx with terbinafine 1% crm BID. F/u in clinic

## 2014-05-29 NOTE — Progress Notes (Signed)
Clinical Social Work Department PSYCHOSOCIAL ASSESSMENT - MATERNAL/CHILD 05/29/2014  Patient:  Alyssa York, Alyssa York  Account Number:  [id number]  Admit Date:  05/26/2014  Ardine Eng Name:   Cherrie Gauze    Clinical Social Worker:  Susan Arana, LCSW   Date/Time:  05/29/2014 12:45 PM  Date Referred:  05/27/2014   Referral source  [facility]     Referred reason  [facility]   Other referral source:    I:  FAMILY / [place] legal guardian:  PARENT  Guardian - Name Guardian - Age Guardian - Address  [address]  [place], [place] [address]  Christoper Allegra  same as above   Other household support members/support persons Other support:   Extensive family support    II  PSYCHOSOCIAL DATA Information Source:  Patient Interview  Occupational hygienist Employment:   Both parents employed   Museum/gallery curator resources:  Multimedia programmer If [place]:   Other  [place] / Grade:   Maternity Care Coordinator / Child Services Coordination / Early Interventions:  Cultural issues impacting care:    III  STRENGTHS Strengths  Home prepared for Child (including basic supplies)  Adequate Resources  Supportive family/friends   Strength comment:    IV  RISK FACTORS AND CURRENT PROBLEMS Current Problem:       V  SOCIAL WORK ASSESSMENT Acknowledged order for social work consult regarding mother not receiving PNC. Met with both parents.   They were pleasant and receptive to social work intervention.  Parent are married and have 2 other dependents ages 48 and 50. Both parents are employed.   Mother states that she found out about the pregnancy late and was in between jobs and could not schedule time off needed for the appointments. She denies hx of mental illness or substance abuse.  UDS on newborn was negative.  Mother informed of the hospital's drug screen policy. Mother reports having a good support system.  No acute social  concerns noted or reported at this time.  Parents informed of social work Fish farm manager.      VI SOCIAL WORK PLAN Social Work Plan  No Barriers to Discharge

## 2014-05-31 ENCOUNTER — Encounter (HOSPITAL_COMMUNITY): Payer: Self-pay | Admitting: *Deleted

## 2014-06-18 ENCOUNTER — Encounter: Payer: Self-pay | Admitting: General Practice

## 2014-06-30 IMAGING — US US OB COMP +14 WK
1 series · 12 of 28 positions shown · non-contrast
Comparison: none

[Series 1: us ob comp +14 wk · 12 of 95 slices shown]
[im 4/95]
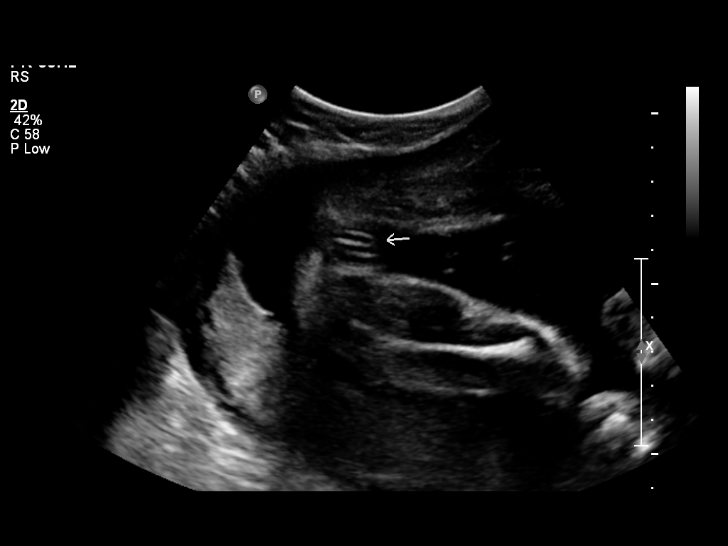
[im 11/95]
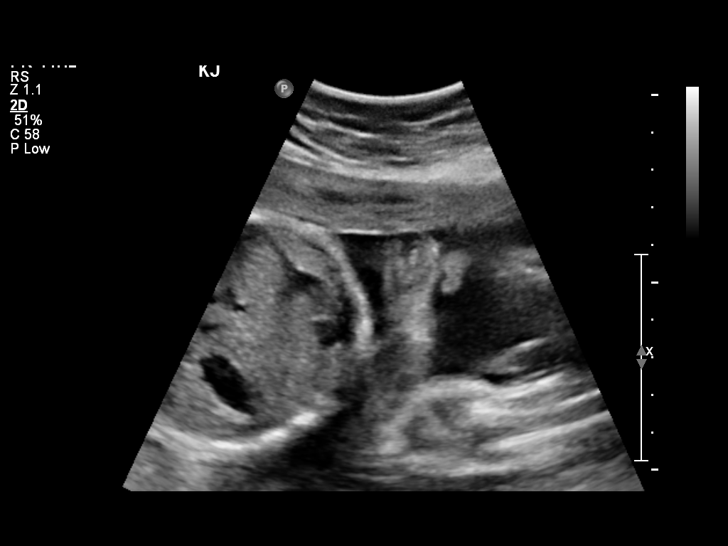
[im 18/95]
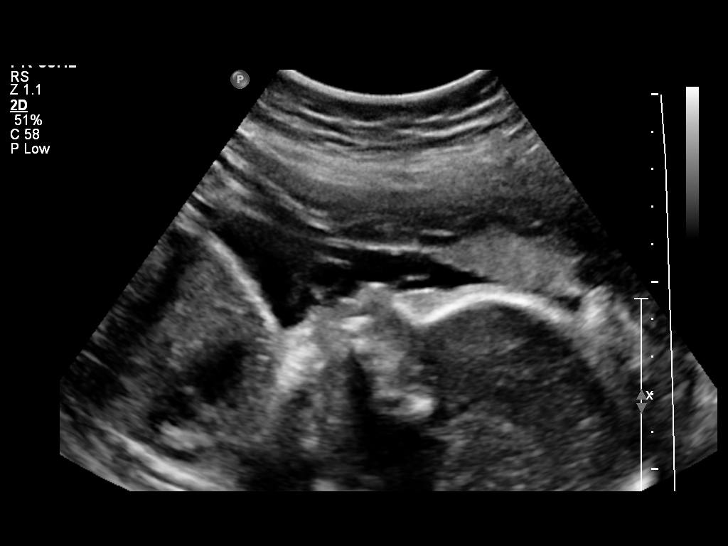
[im 28/95]
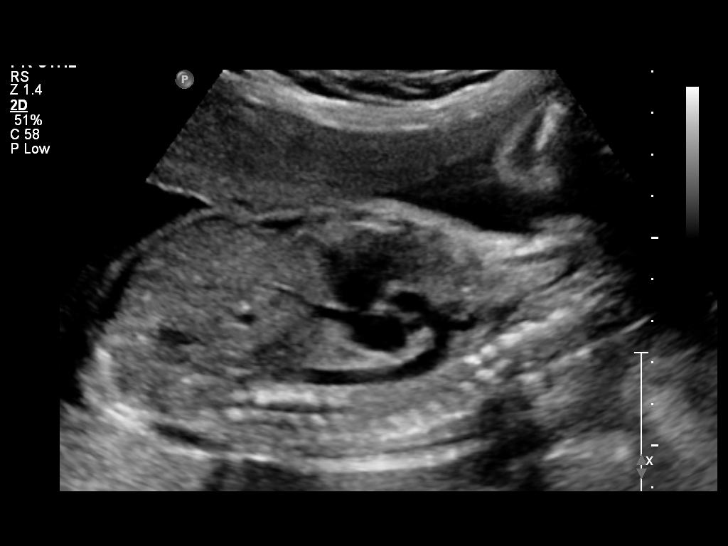
[im 35/95]
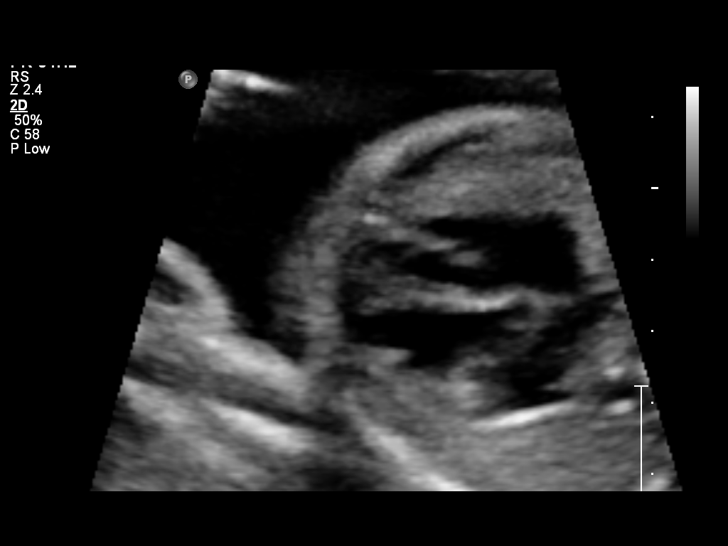
[im 42/95]
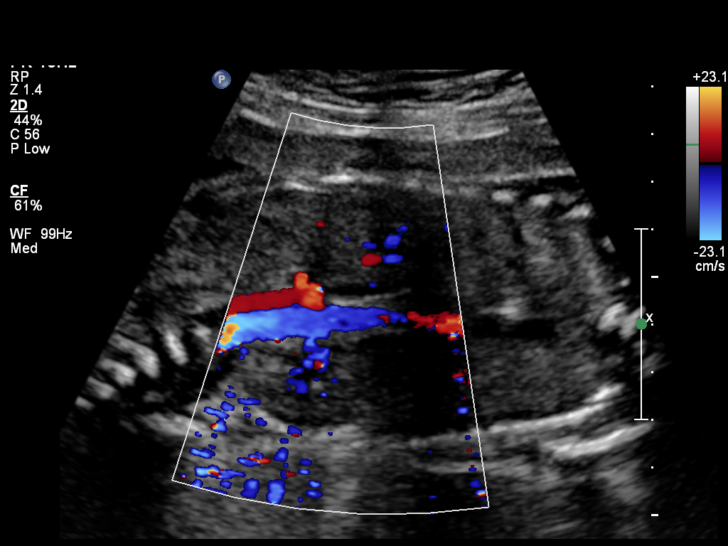
[im 53/95]
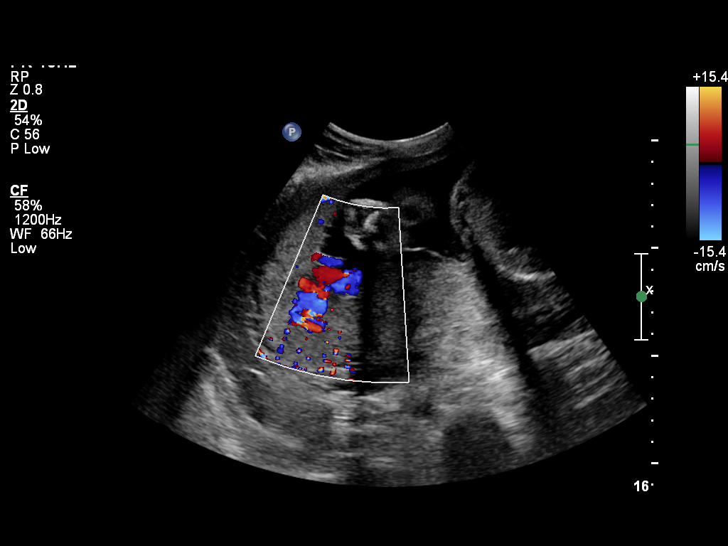
[im 60/95]
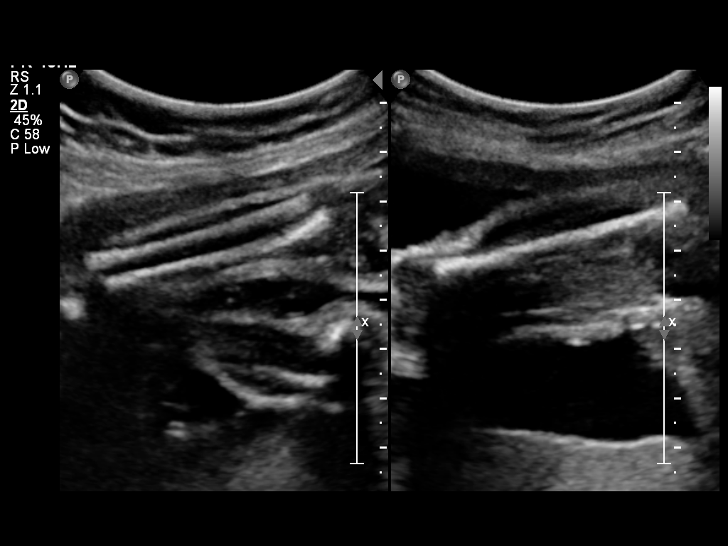
[im 67/95]
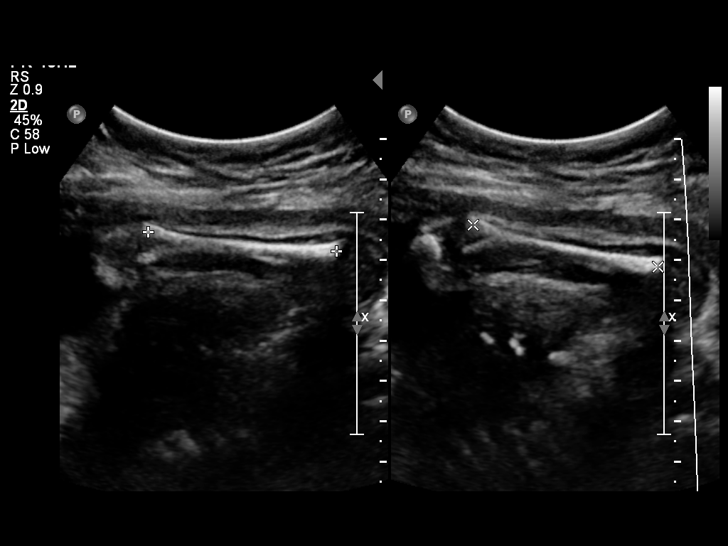
[im 77/95]
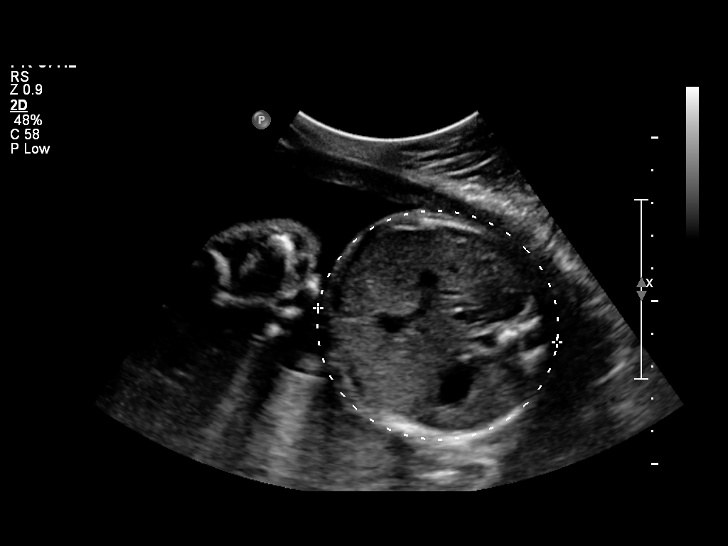
[im 84/95]
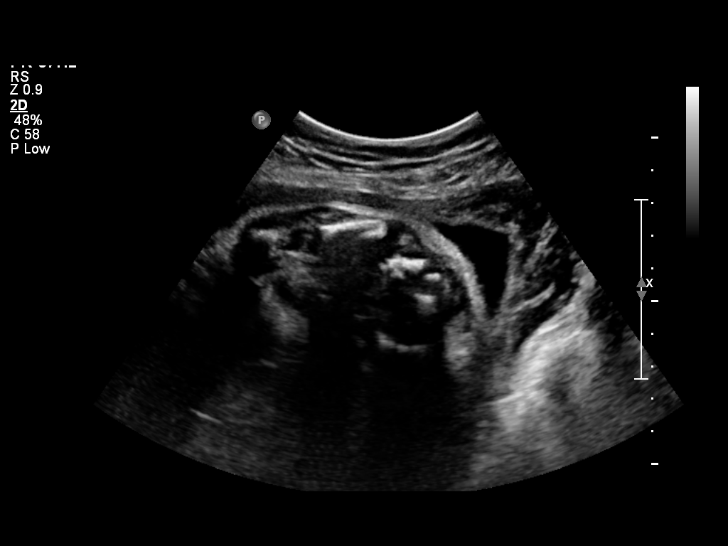
[im 91/95]
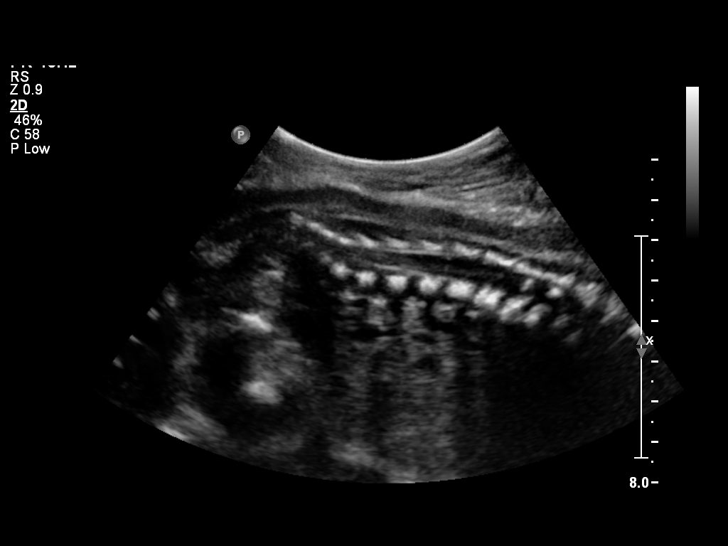

[12 of 28 positions shown; findings below may reference images not displayed]

OBSTETRICS REPORT
                      (Signed Final 03/19/2014 [DATE])

Service(s) Provided

 US OB COMP + 14 WK                                    76805.1
Indications

 Basic anatomic survey
 Previous cesarean section
 No or Little Prenatal Care
Fetal Evaluation

 Num Of Fetuses:    1
 Fetal Heart Rate:  153                          bpm
 Cardiac Activity:  Observed
 Presentation:      Cephalic
 Placenta:          Posterior, above cervical
                    os
 P. Cord            Visualized, central
 Insertion:

 Amniotic Fluid
 AFI FV:      Subjectively within normal limits
 AFI Sum:     14.4    cm       48  %Tile     Larg Pckt:    4.64  cm
 RUQ:   3.56    cm   RLQ:    3.84   cm    LUQ:   4.64    cm   LLQ:    2.36   cm
Biometry

 BPD:     67.7  mm     G. Age:  27w 2d                CI:        69.69   70 - 86
                                                      FL/HC:      20.4   18.8 -

 HC:     258.8  mm     G. Age:  28w 1d       20  %    HC/AC:      1.14   1.05 -

 AC:     226.8  mm     G. Age:  27w 1d       15  %    FL/BPD:     78.1   71 - 87
 FL:      52.9  mm     G. Age:  28w 1d       35  %    FL/AC:      23.3   20 - 24
 HUM:     46.8  mm     G. Age:  27w 4d       33  %

 Est. FW:    5745  gm      2 lb 6 oz     38  %
Gestational Age

 LMP:           28w 1d        Date:  09/03/13                 EDD:   06/10/14
 U/S Today:     27w 5d                                        EDD:   06/13/14
 Best:          28w 1d     Det. By:  LMP  (09/03/13)          EDD:   06/10/14
Anatomy
 Cranium:          Appears normal         Aortic Arch:      Appears normal
 Fetal Cavum:      Appears normal         Ductal Arch:      Appears normal
 Ventricles:       Appears normal         Diaphragm:        Appears normal
 Choroid Plexus:   Appears normal         Stomach:          Appears normal
 Cerebellum:       Appears normal         Abdomen:          Appears normal
 Posterior Fossa:  Appears normal         Abdominal Wall:   Appears nml (cord
                                                            insert, abd wall)
 Nuchal Fold:      Appears normal         Cord Vessels:     Appears normal (3
                                                            vessel cord)
 Face:             Appears normal         Kidneys:          Appear normal
                   (orbits and profile)
 Lips:             Appears normal         Bladder:          Appears normal
 Heart:            Appears normal         Spine:            Appears normal
                   (4CH, axis, and
                   situs)
 RVOT:             Appears normal         Lower             Appears normal
                                          Extremities:
 LVOT:             Appears normal         Upper             Appears normal
                                          Extremities:

 Other:  Fetus appears to be a female. Nasal bone visualized.
Cervix Uterus Adnexa

 Cervical Length:    3.4      cm

 Cervix:       Normal appearance by transabdominal scan.
 Uterus:       No abnormality visualized.
 Cul De Sac:   No free fluid seen.

 Left Ovary:    Within normal limits.
 Right Ovary:   Not visualized.

 Adnexa:     No abnormality visualized.
Impression

 SIUP at 28+1 weeks
 Normal detailed fetal anatomy
 Normal amniotic fluid volume
 Measurements consistent with LMP dating; EFW at the 38th
 %tile
Recommendations

 Follow-up as clinically indicated

## 2014-07-09 ENCOUNTER — Encounter: Payer: Self-pay | Admitting: General Practice

## 2014-07-09 ENCOUNTER — Ambulatory Visit: Payer: 59 | Admitting: Obstetrics & Gynecology

## 2014-07-09 ENCOUNTER — Telehealth: Payer: Self-pay | Admitting: General Practice

## 2014-07-09 NOTE — Telephone Encounter (Signed)
Patient no showed for appt today. Called patient several times but line stays busy. Will send letter

## 2014-07-23 ENCOUNTER — Encounter: Payer: Self-pay | Admitting: General Practice

## 2014-08-19 ENCOUNTER — Ambulatory Visit: Payer: 59 | Admitting: Obstetrics & Gynecology

## 2014-09-27 ENCOUNTER — Encounter (HOSPITAL_COMMUNITY): Payer: Self-pay | Admitting: *Deleted

## 2014-11-26 NOTE — L&D Delivery Note (Signed)
Delivery Note At 4:32 AM a viable female was delivered via  (Presentation: Vertex; OA ).  APGAR: 9, 9; weight  pending.   Placenta status: spontaneous, intact.  Cord: 3 vessel  with the following complications: none.  Anesthesia:  epidural Episiotomy:  none Lacerations:  none Suture Repair: none Est. Blood Loss (mL):  50  Mom to postpartum.  Baby to Couplet care / Skin to Skin.  Shirlee Latchngela Bacigalupo 05/30/2015, 4:45 AM

## 2015-05-03 ENCOUNTER — Emergency Department (HOSPITAL_COMMUNITY)
Admission: EM | Admit: 2015-05-03 | Discharge: 2015-05-03 | Disposition: A | Discharge status: Home / Self Care | Payer: Medicaid Other | Attending: Emergency Medicine | Admitting: Emergency Medicine

## 2015-05-03 ENCOUNTER — Inpatient Hospital Stay (EMERGENCY_DEPARTMENT_HOSPITAL)
Admission: AD | Admit: 2015-05-03 | Discharge: 2015-05-03 | Disposition: A | Discharge status: Home / Self Care | Payer: Medicaid Other | Source: Ambulatory Visit | Attending: Obstetrics | Admitting: Obstetrics

## 2015-05-03 ENCOUNTER — Encounter (HOSPITAL_COMMUNITY): Payer: Self-pay | Admitting: Advanced Practice Midwife

## 2015-05-03 ENCOUNTER — Encounter (HOSPITAL_COMMUNITY): Payer: Self-pay | Admitting: *Deleted

## 2015-05-03 DIAGNOSIS — Z87891 Personal history of nicotine dependence: Secondary | ICD-10-CM | POA: Insufficient documentation

## 2015-05-03 DIAGNOSIS — Z3201 Encounter for pregnancy test, result positive: Secondary | ICD-10-CM

## 2015-05-03 DIAGNOSIS — O9989 Other specified diseases and conditions complicating pregnancy, childbirth and the puerperium: Secondary | ICD-10-CM

## 2015-05-03 DIAGNOSIS — Z79899 Other long term (current) drug therapy: Secondary | ICD-10-CM | POA: Insufficient documentation

## 2015-05-03 DIAGNOSIS — H6591 Unspecified nonsuppurative otitis media, right ear: Secondary | ICD-10-CM

## 2015-05-03 DIAGNOSIS — Z3A35 35 weeks gestation of pregnancy: Secondary | ICD-10-CM | POA: Insufficient documentation

## 2015-05-03 DIAGNOSIS — H748X1 Other specified disorders of right middle ear and mastoid: Secondary | ICD-10-CM | POA: Diagnosis not present

## 2015-05-03 DIAGNOSIS — O0933 Supervision of pregnancy with insufficient antenatal care, third trimester: Secondary | ICD-10-CM

## 2015-05-03 DIAGNOSIS — O99353 Diseases of the nervous system complicating pregnancy, third trimester: Secondary | ICD-10-CM | POA: Insufficient documentation

## 2015-05-03 MED ORDER — AMOXICILLIN 500 MG PO CAPS: 500.0000 mg | ORAL_CAPSULE | Freq: Three times a day (TID) | ORAL | Status: DC

## 2015-05-03 MED ORDER — PRENATAL MULTIVITAMIN CH: 1.0000 | ORAL_TABLET | Freq: Every day | ORAL | Status: DC

## 2015-05-03 MED ORDER — ACETAMINOPHEN 500 MG PO TABS: 500.0000 mg | ORAL_TABLET | Freq: Four times a day (QID) | ORAL | Status: DC | PRN

## 2015-05-03 NOTE — MAU Provider Note (Signed)
History     CSN: [id number]  Arrival date and time: 05/03/15 0815   None     Chief Complaint  Patient presents with  . ? preg verification    HPI This is a 29 y.o. female at [redacted]w[redacted]d by LMP who presents requesting a proof of pregnancy letter.  States was scheduled to see Dr Gaynell Face, but did not like his office so even though she went for an appointment, she did not go back to see him that day. States called our clinic and was told she could not be seen without a proof of pregnancy letter. So she presents here. Already has medicaid, states she was waiting for them to change the name of the doctor on her card, which is why she is so late.  Denies pain, leaking or pain. States wants BTL and wants to sign papers ASAP. States told us the day she delivered (came in with no prenatal care last year) that she wanted one "and yall wouldn't do it".  I explained it was because you need to sign papers in clinic setting 30 days before delivery.   RN note Has preg medicaid. Was told she needed to come here for a preg test in order to go to the clinic.          OB History    Gravida Para Term Preterm AB TAB SAB Ectopic Multiple Living   Past Medical History  Diagnosis Date  . Medical history non-contributory     Past Surgical History  Procedure Laterality Date  . Cesarean section      No family history on file.  History  Substance Use Topics  . Smoking status: Former Smoker    Quit date: 03/11/2010  . Smokeless tobacco: Not on file  . Alcohol Use: No    Allergies: No Known Allergies  Prescriptions prior to admission  Medication Sig Dispense Refill Last Dose  . acetaminophen (TYLENOL) 325 MG tablet Take 650 mg by mouth every 6 (six) hours as needed for moderate pain.   Past Week at Unknown time  . acetaminophen (TYLENOL) 500 MG tablet Take 1 tablet (500 mg total) by mouth every 6 (six) hours as needed. 30 tablet 0   . amoxicillin (AMOXIL) 500 MG capsule Take  1 capsule (500 mg total) by mouth 3 (three) times daily. 21 capsule 0   . clotrimazole (LOTRIMIN) 1 % cream Apply 1 application topically 2 (two) times daily. 30 g 0   . ibuprofen (ADVIL,MOTRIN) 600 MG tablet Take 1 tablet (600 mg total) by mouth every 6 (six) hours. 30 tablet 0   . Prenatal Vit-Fe Fumarate-FA (PRENATAL MULTIVITAMIN) TABS tablet Take 1 tablet by mouth daily at 12 noon. 30 tablet 0   . terbinafine (LAMISIL) 1 % cream Apply topically 2 (two) times daily. 30 g 0     Review of Systems  Constitutional: Negative for fever, chills and malaise/fatigue.  Gastrointestinal: Negative for nausea, vomiting, abdominal pain, diarrhea and constipation.  Genitourinary: Negative for dysuria.  Musculoskeletal: Negative for myalgias.  Neurological: Negative for dizziness and headaches.   Physical Exam   Blood pressure 106/75, pulse 95, temperature 97.5 F (36.4 C), resp. rate 16, height  (1.549 m), weight 174 lb (78.926 kg), last menstrual period 08/31/2014, not currently breastfeeding.  Physical Exam  Constitutional: She is oriented to person, place, and time. She appears well-developed and well-nourished. No distress.  HENT:  Head: Normocephalic.  Cardiovascular: Normal rate.   Respiratory: Effort normal.  Genitourinary:  Exam not indicated   Musculoskeletal: Normal range of motion.  Neurological: She is alert and oriented to person, place, and time.  Skin: Skin is warm and dry.  Psychiatric: She has a normal mood and affect.   Fetal heart rate 155  MAU Course  Procedures  MDM No results found for this or any previous visit (from the past 24 hour(s)).   Assessment and Plan  A:  Single IUP at 7155w0d        No prenatal care       No complaints  P:  Discussed prenatal care       Message sent to clinic for New OB appoointment       Proof of pregnancy letter given  [facility] LLCWILLIAMS,Ikaika Showers 05/03/2015, 8:35 AM   ER 2

## 2015-05-03 NOTE — MAU Note (Signed)
Has preg medicaid. Was told she needed to come here for a preg test in order to go to the clinic.

## 2015-05-03 NOTE — ED Notes (Signed)
Declined W/C at D/C and was escorted to lobby by RN. 

## 2015-05-03 NOTE — ED Notes (Signed)
Pt reports to be [redacted] weeks pregnant without prenatal care. Pt reports she plans to come hee for delivery.

## 2015-05-03 NOTE — Discharge Instructions (Signed)
Please follow up with your primary care physician in 1-2 days. If you do not have one please [facility] listed [facility]. Please follow up with an Ob/Gyn to schedule a follow up appointment. Please take your antibiotic until completion. Please read all discharge instructions and return precautions.    Otitis Media Otitis media is redness, soreness, and inflammation of the middle ear. Otitis media may be caused by allergies or, most commonly, by infection. Often it occurs as a complication of the common cold. SIGNS AND SYMPTOMS Symptoms of otitis media may include:  Earache.  Fever.  Ringing in your ear.  Headache.  Leakage of fluid from the ear. DIAGNOSIS To diagnose otitis media, your health care provider will examine your ear with an otoscope. This is an instrument that allows your health care provider to see into your ear in order to examine your eardrum. Your health care provider also will ask you questions about your symptoms. TREATMENT  Typically, otitis media resolves on its own within 3-5 days. Your health care provider may prescribe medicine to ease your symptoms of pain. If otitis media does not resolve within 5 days or is recurrent, your health care provider may prescribe antibiotic medicines if he or she suspects that a bacterial infection is the cause. HOME CARE INSTRUCTIONS   If you were prescribed an antibiotic medicine, finish it all even if you start to feel better.  Take medicines only as directed by your health care provider.  Keep all follow-up visits as directed by your health care provider. SEEK MEDICAL CARE IF:  You have otitis media only in one ear, or bleeding from your nose, or both.  You notice a lump on your neck.  You are not getting better in 3-5 days.  You feel worse instead of better. SEEK IMMEDIATE MEDICAL CARE IF:   You have pain that is not controlled with medicine.  You have swelling, redness, or pain around  your ear or stiffness in your neck.  You notice that part of your face is paralyzed.  You notice that the bone behind your ear (mastoid) is tender when you touch it. MAKE SURE YOU:   Understand these instructions.  Will watch your condition.  Will get help right away if you are not doing well or get worse. Document Released: 08/17/2004 Document Revised: 03/29/2014 Document Reviewed: 06/09/2013 [facility] Patient Information 2015 [employer], [place]. This information is not intended to replace advice given to you by your health care provider. Make sure you discuss any questions you have with your health care provider.

## 2015-05-03 NOTE — ED Notes (Signed)
Pt reports Rt ear pain for over a month. Pt denies sore throat and denies drainge from ear.

## 2015-05-03 NOTE — ED Provider Notes (Signed)
CSN: [id number]     Arrival date & time 05/03/15  0700 History   First MD Initiated Contact with Patient 05/03/15 [phone]     Chief Complaint  Patient presents with  . Otalgia     (Consider location/radiation/quality/duration/timing/severity/associated sxs/prior Treatment) HPI Comments: Patient is a 29 yo F presenting to the ED for one month of right ear pain. She describes the pain as throbbing in nature with no relief from Tylenol. She does endorse the pain radiates to her face occasionally. Patient states initially she had a runny nose, but that has resolved. Patient reports being approximately [redacted] weeks pregnant without associated abdominal pain, nausea, vomiting, vaginal bleeding or discharge. She has not seen Ob yet regarding her pregnancy.   Patient is a 29 y.o. female presenting with ear pain.  Otalgia Location:  Right Behind ear:  No abnormality Quality:  Throbbing and pressure Onset quality:  Gradual Duration:  4 weeks Timing:  Constant Relieved by:  Nothing Worsened by:  Nothing tried Ineffective treatments:  OTC medications Associated symptoms: no abdominal pain, no fever and no vomiting   Risk factors: no chronic ear infection     Past Medical History  Diagnosis Date  . Medical history non-contributory    Past Surgical History  Procedure Laterality Date  . Cesarean section     History reviewed. No pertinent family history. History  Substance Use Topics  . Smoking status: Former Smoker    Quit date: 03/11/2010  . Smokeless tobacco: Not on file  . Alcohol Use: No   OB History    Gravida Para Term Preterm AB TAB SAB Ectopic Multiple Living   4 3 3       3      Review of Systems  Constitutional: Negative for fever.  HENT: Positive for ear pain.   Gastrointestinal: Negative for vomiting and abdominal pain.  Genitourinary: Negative for vaginal bleeding and vaginal discharge.  All other systems reviewed and are negative.     Allergies  Review of patient's  allergies indicates no known allergies.  Home Medications   Prior to Admission medications   Medication Sig Start Date End Date Taking? Authorizing Provider  acetaminophen (TYLENOL) 325 MG tablet Take 650 mg by mouth every 6 (six) hours as needed for moderate pain.    Historical Provider, MD  acetaminophen (TYLENOL) 500 MG tablet Take 1 tablet (500 mg total) by mouth every 6 (six) hours as needed. 05/03/15   Tarren Velardi, PA-C  amoxicillin (AMOXIL) 500 MG capsule Take 1 capsule (500 mg total) by mouth 3 (three) times daily. 05/03/15   Jebidiah Baggerly, PA-C  clotrimazole (LOTRIMIN) 1 % cream Apply 1 application topically 2 (two) times daily. 05/28/14   Yolande Jollyaleb G Melancon, MD  ibuprofen (ADVIL,MOTRIN) 600 MG tablet Take 1 tablet (600 mg total) by mouth every 6 (six) hours. 05/28/14   Aviva SignsMarie L Williams, CNM  Prenatal Vit-Fe Fumarate-FA (PRENATAL MULTIVITAMIN) TABS tablet Take 1 tablet by mouth daily at 12 noon. 05/03/15   Adelina Collard, PA-C  terbinafine (LAMISIL) 1 % cream Apply topically 2 (two) times daily. 05/28/14   Minta BalsamMichael R Odom, MD   BP 119/71 mmHg  Pulse 94  Temp(Src) 98.1 F (36.7 C) (Oral)  Resp 18  SpO2 98%  LMP 08/31/2014  Breastfeeding? No Physical Exam  Constitutional: She is oriented to person, place, and time. She appears well-developed and well-nourished. No distress.  HENT:  Head: Normocephalic and atraumatic.  Right Ear: Hearing, external ear and ear canal normal. No mastoid  tenderness. A middle ear effusion is present.  Left Ear: Hearing, tympanic membrane, external ear and ear canal normal. No mastoid tenderness.  Nose: Nose normal.  Mouth/Throat: Oropharynx is clear and moist. No oropharyngeal exudate.  Eyes: Conjunctivae are normal.  Neck: Normal range of motion. Neck supple.  No nuchal rigidity.   Cardiovascular: Normal rate, regular rhythm and normal heart sounds.   Pulmonary/Chest: Effort normal and breath sounds normal. No respiratory distress.   Abdominal: Soft. There is no tenderness.  Fetal heart rate 135bpm  Musculoskeletal: Normal range of motion.  Lymphadenopathy:    She has no cervical adenopathy.  Neurological: She is alert and oriented to person, place, and time.  Skin: Skin is warm and dry. She is not diaphoretic.  Psychiatric: She has a normal mood and affect.  Nursing note and vitals reviewed.   ED Course  Procedures (including critical care time) Medications - No data to display  Labs Review Labs Reviewed - No data to display  Imaging Review No results found.   EKG Interpretation None       MDM   Final diagnoses:  Middle ear effusion, right    Filed Vitals:   05/03/15 0707  BP: 119/71  Pulse: 94  Temp: 98.1 F (36.7 C)  Resp: 18   Afebrile, NAD, non-toxic appearing, AAOx4.   No abdominal pain, vaginal bleeding or discharge. Patient has felt fetal movement without change. Fetal heart tones within normal limits.  Patient presents with otalgia and exam consistent with acute otitis media. No concern for acute mastoiditis, meningitis.  No antibiotic use in the last month.  Patient discharged home with Amoxicillin.  Advised PCP and Ob/Gyn follow up.  I have also discussed reasons to return immediately to the ER.  Patient is agreeable to plan. Patient is stable at time of discharge.        Francee Piccolo, PA-C 05/03/15 [id number]  Tilden Fossa, MD 05/03/15 1409

## 2015-05-05 ENCOUNTER — Ambulatory Visit (INDEPENDENT_AMBULATORY_CARE_PROVIDER_SITE_OTHER): Payer: Medicaid Other | Admitting: Family

## 2015-05-05 ENCOUNTER — Encounter: Payer: Self-pay | Admitting: Family

## 2015-05-05 ENCOUNTER — Other Ambulatory Visit (HOSPITAL_COMMUNITY)
Admission: RE | Admit: 2015-05-05 | Discharge: 2015-05-05 | Disposition: A | Discharge status: Home / Self Care | Payer: Medicaid Other | Source: Ambulatory Visit | Attending: Family | Admitting: Family

## 2015-05-05 VITALS — BP 109/66 | HR 97 | Temp 98.0°F | Wt 170.7 lb

## 2015-05-05 DIAGNOSIS — Z01419 Encounter for gynecological examination (general) (routine) without abnormal findings: Secondary | ICD-10-CM | POA: Diagnosis not present

## 2015-05-05 DIAGNOSIS — O0930 Supervision of pregnancy with insufficient antenatal care, unspecified trimester: Secondary | ICD-10-CM

## 2015-05-05 DIAGNOSIS — O0933 Supervision of pregnancy with insufficient antenatal care, third trimester: Secondary | ICD-10-CM

## 2015-05-05 DIAGNOSIS — Z23 Encounter for immunization: Secondary | ICD-10-CM | POA: Diagnosis not present

## 2015-05-05 DIAGNOSIS — Z113 Encounter for screening for infections with a predominantly sexual mode of transmission: Secondary | ICD-10-CM | POA: Insufficient documentation

## 2015-05-05 DIAGNOSIS — O3421 Maternal care for scar from previous cesarean delivery: Secondary | ICD-10-CM

## 2015-05-05 DIAGNOSIS — O34219 Maternal care for unspecified type scar from previous cesarean delivery: Secondary | ICD-10-CM

## 2015-05-05 LAB — OB RESULTS CONSOLE GBS: GBS: NEGATIVE

## 2015-05-05 LAB — OB RESULTS CONSOLE GC/CHLAMYDIA
Chlamydia: NEGATIVE
Gonorrhea: NEGATIVE

## 2015-05-05 MED ORDER — TETANUS-DIPHTH-ACELL PERTUSSIS 5-2.5-18.5 LF-MCG/0.5 IM SUSP
0.5000 mL | Freq: Once | INTRAMUSCULAR | Status: AC
  Administered 2015-05-05: 0.5 mL via INTRAMUSCULAR

## 2015-05-05 NOTE — Progress Notes (Signed)
Pt is being seen today for first OB visit nutrition consult.  Pt was overweight pregravid based on reported pregravid wt of 155#.  Wt gain is WNL today, 15.7# at [redacted]w[redacted]d.  Reports variable appetite recently.  Typical intake is 3 meals/day plus fruit for snacks.  NKFA.  No N/V reported.  Taking PNV and amoxicillin for ear infection.  Tylenol prn.  Voices no concerns at this time.  Discussed wt gain goal of 15-25# overall.  Testing for GDM today.  Plans to BF.  Successfully BF with older children but only for about 3 wks each.  Pt is already receiving WIC.  Follow-up if referred.  Melanee Left MPH RD LDN

## 2015-05-05 NOTE — Addendum Note (Signed)
Addended by: Candelaria Stagers E on: 05/05/2015 11:35 AM   Modules accepted: Orders

## 2015-05-05 NOTE — Patient Instructions (Signed)
Third Trimester of Pregnancy The third trimester is from week 29 through week 42, months 7 through 9. The third trimester is a time when the fetus is growing rapidly. At the end of the ninth month, the fetus is about 20 inches in length and weighs 6-10 pounds.  BODY CHANGES Your body goes through many changes during pregnancy. The changes vary from woman to woman.   Your weight will continue to increase. You can expect to gain 25-35 pounds (11-16 kg) by the end of the pregnancy.  You may begin to get stretch marks on your hips, abdomen, and breasts.  You may urinate more often because the fetus is moving lower into your pelvis and pressing on your bladder.  You may develop or continue to have heartburn as a result of your pregnancy.  You may develop constipation because certain hormones are causing the muscles that push waste through your intestines to slow down.  You may develop hemorrhoids or swollen, bulging veins (varicose veins).  You may have pelvic pain because of the weight gain and pregnancy hormones relaxing your joints between the bones in your pelvis. Backaches may result from overexertion of the muscles supporting your posture.  You may have changes in your hair. These can include thickening of your hair, rapid growth, and changes in texture. Some women also have hair loss during or after pregnancy, or hair that feels dry or thin. Your hair will most likely return to normal after your baby is born.  Your breasts will continue to grow and be tender. A yellow discharge may leak from your breasts called colostrum.  Your belly button may stick out.  You may feel short of breath because of your expanding uterus.  You may notice the fetus "dropping," or moving lower in your abdomen.  You may have a bloody mucus discharge. This usually occurs a few days to a week before labor begins.  Your cervix becomes thin and soft (effaced) near your due date. WHAT TO EXPECT AT YOUR  PRENATAL EXAMS  You will have prenatal exams every 2 weeks until week 36. Then, you will have weekly prenatal exams. During a routine prenatal visit:  You will be weighed to make sure you and the fetus are growing normally.  Your blood pressure is taken.  Your abdomen will be measured to track your baby's growth.  The fetal heartbeat will be listened to.  Any test results from the previous visit will be discussed.  You may have a cervical check near your due date to see if you have effaced. At around 36 weeks, your caregiver will check your cervix. At the same time, your caregiver will also perform a test on the secretions of the vaginal tissue. This test is to determine if a type of bacteria, Group B streptococcus, is present. Your caregiver will explain this further. Your caregiver may ask you:  What your birth plan is.  How you are feeling.  If you are feeling the baby move.  If you have had any abnormal symptoms, such as leaking fluid, bleeding, severe headaches, or abdominal cramping.  If you have any questions. Other tests or screenings that may be performed during your third trimester include:  Blood tests that check for low iron levels (anemia).  Fetal testing to check the health, activity level, and growth of the fetus. Testing is done if you have certain medical conditions or if there are problems during the pregnancy. FALSE LABOR You may feel small, irregular contractions that   eventually go away. These are called Braxton Hicks contractions, or false labor. Contractions may last for hours, days, or even weeks before true labor sets in. If contractions come at regular intervals, intensify, or become painful, it is best to be seen by your caregiver.  SIGNS OF LABOR   Menstrual-like cramps.  Contractions that are 5 minutes apart or less.  Contractions that start on the top of the uterus and spread down to the lower abdomen and back.  A sense of increased pelvic  pressure or back pain.  A watery or bloody mucus discharge that comes from the vagina. If you have any of these signs before the 37th week of pregnancy, call your caregiver right away. You need to go to the hospital to get checked immediately. HOME CARE INSTRUCTIONS   Avoid all smoking, herbs, alcohol, and unprescribed drugs. These chemicals affect the formation and growth of the baby.  Follow your caregiver's instructions regarding medicine use. There are medicines that are either safe or unsafe to take during pregnancy.  Exercise only as directed by your caregiver. Experiencing uterine cramps is a good sign to stop exercising.  Continue to eat regular, healthy meals.  Wear a good support bra for breast tenderness.  Do not use hot tubs, steam rooms, or saunas.  Wear your seat belt at all times when driving.  Avoid raw meat, uncooked cheese, cat litter boxes, and soil used by cats. These carry germs that can cause birth defects in the baby.  Take your prenatal vitamins.  Try taking a stool softener (if your caregiver approves) if you develop constipation. Eat more high-fiber foods, such as fresh vegetables or fruit and whole grains. Drink plenty of fluids to keep your urine clear or pale yellow.  Take warm sitz baths to soothe any pain or discomfort caused by hemorrhoids. Use hemorrhoid cream if your caregiver approves.  If you develop varicose veins, wear support hose. Elevate your feet for 15 minutes, 3-4 times a day. Limit salt in your diet.  Avoid heavy lifting, wear low heal shoes, and practice good posture.  Rest a lot with your legs elevated if you have leg cramps or low back pain.  Visit your dentist if you have not gone during your pregnancy. Use a soft toothbrush to brush your teeth and be gentle when you floss.  A sexual relationship may be continued unless your caregiver directs you otherwise.  Do not travel far distances unless it is absolutely necessary and only  with the approval of your caregiver.  Take prenatal classes to understand, practice, and ask questions about the labor and delivery.  Make a trial run to the hospital.  Pack your hospital bag.  Prepare the baby's nursery.  Continue to go to all your prenatal visits as directed by your caregiver. SEEK MEDICAL CARE IF:  You are unsure if you are in labor or if your water has broken.  You have dizziness.  You have mild pelvic cramps, pelvic pressure, or nagging pain in your abdominal area.  You have persistent nausea, vomiting, or diarrhea.  You have a bad smelling vaginal discharge.  You have pain with urination. SEEK IMMEDIATE MEDICAL CARE IF:   You have a fever.  You are leaking fluid from your vagina.  You have spotting or bleeding from your vagina.  You have severe abdominal cramping or pain.  You have rapid weight loss or gain.  You have shortness of breath with chest pain.  You notice sudden or extreme swelling   of your face, hands, ankles, feet, or legs. °· You have not felt your baby move in over an hour. °· You have severe headaches that do not go away with medicine. °· You have vision changes. °Document Released: 11/06/2001 Document Revised: 11/17/2013 Document Reviewed: 01/13/2013 °ExitCare® Patient Information ©2015 [employer]. This information is not intended to replace advice given to you by your health care provider. Make sure you discuss any questions you have with your health care provider. °Trial of Labor After Cesarean Delivery Information °A trial of labor after cesarean delivery (TOLAC) is when a woman tries to give birth vaginally after a previous cesarean delivery. TOLAC may be a safe and appropriate option for you depending on your medical history and other risk factors. When TOLAC is successful and you are able to have a vaginal delivery, this is called a vaginal birth after cesarean delivery (VBAC).  °CANDIDATES FOR TOLAC °TOLAC is possible for some women who: °· Have  undergone one or two prior cesarean deliveries in which the incision of the uterus was horizontal (low transverse). °· Are carrying twins and have had one prior low transverse incision during a cesarean delivery. °· Do not have a vertical (classical) uterine scar. °· Have not had a tear in the wall of their uterus (uterine rupture). °TOLAC is also supported for women who meet appropriate criteria and: °· Are under the age of 40 years. °· Are tall and have a body mass index (BMI) of less than 30. °· Have an unknown uterine scar. °· Give birth in a facility equipped to handle an emergency cesarean delivery. This team should be able to handle possible complications such as a uterine rupture. °· Have thorough counseling about the benefits and risks of TOLAC. °· Have discussed future pregnancy plans with their health care provider. °· Plan to have several more pregnancies. °MOST SUCCESSFUL CANDIDATES FOR TOLAC: °· Have had a successful vaginal delivery before or after their cesarean delivery. °· Experience labor that begins naturally on or before the due date (40 weeks of gestation). °· Do not have a very large (macrosomic) baby.   °· Had a prior cesarean delivery but are not currently experiencing factors that would prompt a cesarean delivery (such as a breech position). °· Had only one prior cesarean delivery.  °· Had a prior cesarean delivery that was performed early in labor and not after full cervical dilation. °TOLAC may be most appropriate for women who meet the above guidelines and who plan to have more pregnancies. TOLAC is not recommended for home births. °LEAST SUCCESSFUL CANDIDATES FOR TOLAC: °· Have an induced labor with an unfavorable cervix. An unfavorable cervix is when the cervix is not dilating enough (among other factors). °· Have never had a vaginal delivery. °· Have had more than two cesarean deliveries. °· Have a pregnancy at more than 40 weeks of gestation. °· Are pregnant with a baby with a  suspected weight greater than 4,000 grams (8½ pounds) and who have no prior history of a vaginal delivery. °· Have closely spaced pregnancies. °SUGGESTED BENEFITS OF TOLAC °· You may have a faster recovery time. °· You may have a shorter stay in the hospital. °· You may have less pain and fewer problems than with a cesarean delivery. Women who have a cesarean delivery have a higher chance of needing blood or getting a fever, an infection, or a blood clot in the legs. °SUGGESTED RISKS OF TOLAC °The highest risk of complications happens to women who attempt a TOLAC   and fail. A failed TOLAC results in an unplanned cesarean delivery. Risks related to TOLAC or repeat cesarean deliveries include:  °· Blood loss. °· Infection. °· Blood clot. °· Injury to surrounding tissues or organs. °· Having to remove the uterus (hysterectomy). °· Potential problems with the placenta (such as placenta previa or placenta accreta) in future pregnancies.  °Although very rare, the main concerns with TOLAC are: °· Rupture of the uterine scar from a past cesarean delivery. °· Needing an emergency cesarean delivery. °· Having a bad outcome for the baby (perinatal morbidity). °FOR MORE INFORMATION °American Congress of Obstetricians and Gynecologists: www.acog.org °American College of Nurse-Midwives: www.midwife.org °Document Released: 07/31/2011 Document Revised: 09/02/2013 Document Reviewed: 05/04/2013 °ExitCare® Patient Information ©2015 [employer]. This information is not intended to replace advice given to you by your health care provider. Make sure you discuss any questions you have with your health care provider. ° °

## 2015-05-05 NOTE — Progress Notes (Signed)
BTL consent signed. Copy to the patient and original to Royetta Asal, Charity fundraiser. Frankey Poot, RN)

## 2015-05-05 NOTE — Progress Notes (Signed)
Interested in BTL  Trace blood, ketones, and small leuks on UA U/s scheduled for 6/13

## 2015-05-05 NOTE — Progress Notes (Signed)
   Subjective:    Alyssa York is a [id number] [redacted]w[redacted]d being seen today for her first obstetrical visit.  Her obstetrical history is significant for late to care, close spacing of pregnancy (July 2015), Hx of csection with two successful VBACs. Patient does intend to breast feed. Pregnancy history fully reviewed.  Patient reports no bleeding, no contractions and no leaking.  Filed Vitals:   05/05/15 0829  BP: 109/66  Pulse: 97  Temp: 98 F (36.7 C)  Weight: 170 lb 11.2 oz (77.429 kg)    HISTORY: OB History  Gravida Para Term Preterm AB SAB TAB Ectopic Multiple Living  4 3 3  0 0 0 0 0 0 3    # Outcome Date GA Lbr Len/2nd Weight Sex Delivery Anes PTL Lv  4 Current           3 Term 05/27/14 [redacted]w[redacted]d 38:47 / 00:14 6 lb 4.5 oz (2.85 kg) F Vag-Spont EPI  Y     Comments: [id number]  2 Term 09/26/12 [redacted]w[redacted]d  6 lb 1 oz (2.75 kg) M Vag-Spont EPI N Y  1 Term 10/12/11 [redacted]w[redacted]d  7 lb 11 oz (3.487 kg) M CS-LTranv Spinal N Y     Past Medical History  Diagnosis Date  . Medical history non-contributory    Past Surgical History  Procedure Laterality Date  . Cesarean section     History reviewed. No pertinent family history.   Exam    BP 109/66 mmHg  Pulse 97  Temp(Src) 98 F (36.7 C)  Wt 170 lb 11.2 oz (77.429 kg)  LMP 08/31/2014 (Exact Date) Uterine Size: size equals dates  Pelvic Exam:    Perineum: No Hemorrhoids, Normal Perineum   Vulva: normal   Vagina:  normal mucosa, normal discharge, no palpable nodules   pH: Not done   Cervix: no bleeding following Pap, no cervical motion tenderness and no lesions   Adnexa: normal adnexa and no mass, fullness, tenderness   Bony Pelvis: Adequate  System: Breast:  No nipple retraction or dimpling, No nipple discharge or bleeding, No axillary or supraclavicular adenopathy, Normal to palpation without dominant masses   Skin: normal coloration and turgor, no rashes    Neurologic: negative   Extremities: normal strength, tone, and muscle mass   HEENT  neck supple with midline trachea and thyroid without masses   Mouth/Teeth mucous membranes moist, pharynx normal without lesions   Neck supple and no masses   Cardiovascular: regular rate and rhythm, no murmurs or gallops   Respiratory:  appears well, vitals normal, no respiratory distress, acyanotic, normal RR, neck free of mass or lymphadenopathy, chest clear, no wheezing, crepitations, rhonchi, normal symmetric air entry   Abdomen: soft, non-tender; bowel sounds normal; no masses,  no organomegaly   Urinary: urethral meatus normal      Assessment:    Pregnancy:   29 y.o. G4P3003 at [redacted]w[redacted]d wks IUP  Patient Active Problem List   Diagnosis Date Noted  . Late prenatal care affecting pregnancy 05/05/2015  . No prenatal care in current pregnancy in third trimester 03/11/2014  . Previous cesarean section complicating pregnancy, antepartum 03/11/2014     Plan:     Initial labs drawn.  Pap smear collected w/GC/CT. TOLAC consent signed.   Problem list reviewed and updated. Genetic Screening discussed:  Too late Ultrasound discussed; fetal survey: ordered. Follow up in 1 weeks.  Marlis Edelson 05/05/2015

## 2015-05-06 LAB — PRENATAL PROFILE (SOLSTAS)
ANTIBODY SCREEN: NEGATIVE
Basophils Absolute: 0 10*3/uL (ref 0.0–0.1)
Basophils Relative: 0 % (ref 0–1)
EOS ABS: 0.1 10*3/uL (ref 0.0–0.7)
EOS PCT: 1 % (ref 0–5)
HCT: 36.9 % (ref 36.0–46.0)
HEMOGLOBIN: 12.1 g/dL (ref 12.0–15.0)
HEP B S AG: NEGATIVE
HIV 1&2 Ab, 4th Generation: NONREACTIVE
LYMPHS ABS: 1.2 10*3/uL (ref 0.7–4.0)
LYMPHS PCT: 17 % (ref 12–46)
MCH: 28.7 pg (ref 26.0–34.0)
MCHC: 32.8 g/dL (ref 30.0–36.0)
MCV: 87.4 fL (ref 78.0–100.0)
MONOS PCT: 7 % (ref 3–12)
MPV: 9.6 fL (ref 8.6–12.4)
Monocytes Absolute: 0.5 10*3/uL (ref 0.1–1.0)
Neutro Abs: 5.2 10*3/uL (ref 1.7–7.7)
Neutrophils Relative %: 75 % (ref 43–77)
Platelets: 174 10*3/uL (ref 150–400)
RBC: 4.22 MIL/uL (ref 3.87–5.11)
RDW: 13.9 % (ref 11.5–15.5)
RH TYPE: POSITIVE
RUBELLA: 2.48 {index} — AB (ref <0.90)
WBC: 6.9 10*3/uL (ref 4.0–10.5)

## 2015-05-06 LAB — CULTURE, OB URINE
COLONY COUNT: NO GROWTH
ORGANISM ID, BACTERIA: NO GROWTH

## 2015-05-06 LAB — GLUCOSE TOLERANCE, 1 HOUR (50G) W/O FASTING: GLUCOSE 1 HOUR GTT: 57 mg/dL — AB (ref 70–140)

## 2015-05-06 LAB — PRESCRIPTION MONITORING PROFILE (19 PANEL)
Amphetamine/Meth: NEGATIVE ng/mL
Barbiturate Screen, Urine: NEGATIVE ng/mL
Benzodiazepine Screen, Urine: NEGATIVE ng/mL
Buprenorphine, Urine: NEGATIVE ng/mL
CANNABINOID SCRN UR: NEGATIVE ng/mL
COCAINE METABOLITES: NEGATIVE ng/mL
CREATININE, URINE: 211.11 mg/dL (ref >20.0)
Carisoprodol, Urine: NEGATIVE ng/mL
Fentanyl, Ur: NEGATIVE ng/mL
MDMA URINE: NEGATIVE ng/mL
METHADONE SCREEN, URINE: NEGATIVE ng/mL
Meperidine, Ur: NEGATIVE ng/mL
Methaqualone: NEGATIVE ng/mL
NITRITES URINE, INITIAL: NEGATIVE ug/mL
OPIATE SCREEN, URINE: NEGATIVE ng/mL
OXYCODONE SCRN UR: NEGATIVE ng/mL
PHENCYCLIDINE, UR: NEGATIVE ng/mL
Propoxyphene: NEGATIVE ng/mL
TAPENTADOLUR: NEGATIVE ng/mL
Tramadol Scrn, Ur: NEGATIVE ng/mL
Zolpidem, Urine: NEGATIVE ng/mL
pH, Initial: 7.1 pH (ref 4.5–8.9)

## 2015-05-06 LAB — POCT URINALYSIS DIP (DEVICE)
Bilirubin Urine: NEGATIVE
GLUCOSE, UA: NEGATIVE mg/dL
Nitrite: NEGATIVE
Protein, ur: NEGATIVE mg/dL
Specific Gravity, Urine: 1.02 (ref 1.005–1.030)
Urobilinogen, UA: 0.2 mg/dL (ref 0.0–1.0)
pH: 7 (ref 5.0–8.0)

## 2015-05-06 LAB — CYTOLOGY - PAP

## 2015-05-08 LAB — CULTURE, BETA STREP (GROUP B ONLY)

## 2015-05-09 ENCOUNTER — Ambulatory Visit (HOSPITAL_COMMUNITY)
Admission: RE | Admit: 2015-05-09 | Discharge: 2015-05-09 | Disposition: A | Discharge status: Home / Self Care | Payer: Medicaid Other | Source: Ambulatory Visit | Attending: Obstetrics & Gynecology | Admitting: Obstetrics & Gynecology

## 2015-05-09 DIAGNOSIS — Z3689 Encounter for other specified antenatal screening: Secondary | ICD-10-CM | POA: Insufficient documentation

## 2015-05-09 DIAGNOSIS — O0933 Supervision of pregnancy with insufficient antenatal care, third trimester: Secondary | ICD-10-CM | POA: Diagnosis present

## 2015-05-09 LAB — HEMOGLOBINOPATHY EVALUATION
HGB A: 97 % (ref 96.8–97.8)
HGB F QUANT: 0 % (ref 0.0–2.0)
Hemoglobin Other: 0 %
Hgb A2 Quant: 3 % (ref 2.2–3.2)
Hgb S Quant: 0 %

## 2015-05-10 ENCOUNTER — Encounter (HOSPITAL_COMMUNITY): Payer: Self-pay

## 2015-05-10 ENCOUNTER — Encounter: Payer: Self-pay | Admitting: *Deleted

## 2015-05-12 ENCOUNTER — Encounter: Payer: Self-pay | Admitting: Family

## 2015-05-12 ENCOUNTER — Ambulatory Visit (INDEPENDENT_AMBULATORY_CARE_PROVIDER_SITE_OTHER): Payer: Medicaid Other | Admitting: Family

## 2015-05-12 VITALS — BP 111/58 | HR 78 | Temp 98.6°F | Wt 173.8 lb

## 2015-05-12 DIAGNOSIS — O3421 Maternal care for scar from previous cesarean delivery: Secondary | ICD-10-CM | POA: Diagnosis not present

## 2015-05-12 DIAGNOSIS — O0933 Supervision of pregnancy with insufficient antenatal care, third trimester: Secondary | ICD-10-CM | POA: Diagnosis not present

## 2015-05-12 LAB — POCT URINALYSIS DIP (DEVICE)
Bilirubin Urine: NEGATIVE
GLUCOSE, UA: NEGATIVE mg/dL
KETONES UR: NEGATIVE mg/dL
Nitrite: NEGATIVE
Protein, ur: 30 mg/dL — AB
Specific Gravity, Urine: 1.025 (ref 1.005–1.030)
Urobilinogen, UA: 1 mg/dL (ref 0.0–1.0)
pH: 6.5 (ref 5.0–8.0)

## 2015-05-12 MED ORDER — FLUCONAZOLE 150 MG PO TABS: 150.0000 mg | ORAL_TABLET | Freq: Once | ORAL | Status: DC

## 2015-05-12 NOTE — Progress Notes (Signed)
Reports vaginal itching and redness, +dysuria.  Recently completed amoxicillin.  RX Diflucan.  Reviewed GBS and GC/CT results.  Reports blood tinged mucus discharge.  No report of contractions.

## 2015-05-12 NOTE — Progress Notes (Signed)
C/o scant bright red spotting with mucous, noted last night mucousy discharge. Denies intercourse last night or today. C/o burning with urination. C/o vaginal /perineal itching /swelling.Urinalysis shows trace blood.

## 2015-05-13 ENCOUNTER — Encounter (HOSPITAL_COMMUNITY): Payer: Self-pay | Admitting: *Deleted

## 2015-05-13 ENCOUNTER — Emergency Department (HOSPITAL_COMMUNITY)
Admission: EM | Admit: 2015-05-13 | Discharge: 2015-05-14 | Disposition: A | Discharge status: Home / Self Care | Payer: Medicaid Other | Attending: Emergency Medicine | Admitting: Emergency Medicine

## 2015-05-13 DIAGNOSIS — H9201 Otalgia, right ear: Secondary | ICD-10-CM

## 2015-05-13 DIAGNOSIS — O99613 Diseases of the digestive system complicating pregnancy, third trimester: Secondary | ICD-10-CM | POA: Diagnosis not present

## 2015-05-13 DIAGNOSIS — Z87891 Personal history of nicotine dependence: Secondary | ICD-10-CM | POA: Diagnosis not present

## 2015-05-13 DIAGNOSIS — Z79899 Other long term (current) drug therapy: Secondary | ICD-10-CM | POA: Diagnosis not present

## 2015-05-13 DIAGNOSIS — H6591 Unspecified nonsuppurative otitis media, right ear: Secondary | ICD-10-CM | POA: Insufficient documentation

## 2015-05-13 DIAGNOSIS — Z3A37 37 weeks gestation of pregnancy: Secondary | ICD-10-CM | POA: Insufficient documentation

## 2015-05-13 DIAGNOSIS — Z792 Long term (current) use of antibiotics: Secondary | ICD-10-CM | POA: Diagnosis not present

## 2015-05-13 DIAGNOSIS — M542 Cervicalgia: Secondary | ICD-10-CM | POA: Diagnosis not present

## 2015-05-13 DIAGNOSIS — O99353 Diseases of the nervous system complicating pregnancy, third trimester: Secondary | ICD-10-CM | POA: Diagnosis present

## 2015-05-13 DIAGNOSIS — O9989 Other specified diseases and conditions complicating pregnancy, childbirth and the puerperium: Secondary | ICD-10-CM | POA: Insufficient documentation

## 2015-05-13 DIAGNOSIS — K088 Other specified disorders of teeth and supporting structures: Secondary | ICD-10-CM | POA: Diagnosis not present

## 2015-05-13 MED ORDER — LORATADINE 10 MG PO TABS: 10.0000 mg | ORAL_TABLET | Freq: Every day | ORAL | Status: DC

## 2015-05-13 NOTE — Discharge Instructions (Signed)
Return to the emergency room with worsening of symptoms, new symptoms or with symptoms that are concerning , especially severe worsening of headache, visual or speech changes, weakness in face, arms or legs. Claritin daily. Follow up with ENT for persistent pain. Call to make follow up appointment with a dentist. Use below resources to establish care. Read below information and follow recommendations. Otitis Media With Effusion Otitis media with effusion is the presence of fluid in the middle ear. This is a common problem in children, which often follows ear infections. It may be present for weeks or longer after the infection. Unlike an acute ear infection, otitis media with effusion refers only to fluid behind the ear drum and not infection. Children with repeated ear and sinus infections and allergy problems are the most likely to get otitis media with effusion. CAUSES  The most frequent cause of the fluid buildup is dysfunction of the eustachian tubes. These are the tubes that drain fluid in the ears to the back of the nose (nasopharynx). SYMPTOMS   The main symptom of this condition is hearing loss. As a result, you or your child may:  Listen to the TV at a loud volume.  Not respond to questions.  Ask "what" often when spoken to.  Mistake or confuse one sound or word for another.  There may be a sensation of fullness or pressure but usually not pain. DIAGNOSIS   Your health care provider will diagnose this condition by examining you or your child's ears.  Your health care provider may test the pressure in you or your child's ear with a tympanometer.  A hearing test may be conducted if the problem persists. TREATMENT   Treatment depends on the duration and the effects of the effusion.  Antibiotics, decongestants, nose drops, and cortisone-type drugs (tablets or nasal spray) may not be helpful.  Children with persistent ear effusions may have delayed language or behavioral  problems. Children at risk for developmental delays in hearing, learning, and speech may require referral to a specialist earlier than children not at risk.  You or your child's health care provider may suggest a referral to an ear, nose, and throat surgeon for treatment. The following may help restore normal hearing:  Drainage of fluid.  Placement of ear tubes (tympanostomy tubes).  Removal of adenoids (adenoidectomy). HOME CARE INSTRUCTIONS   Avoid secondhand smoke.  Infants who are breastfed are less likely to have this condition.  Avoid feeding infants while they are lying flat.  Avoid known environmental allergens.  Avoid people who are sick. SEEK MEDICAL CARE IF:   Hearing is not better in 3 months.  Hearing is worse.  Ear pain.  Drainage from the ear.  Dizziness. MAKE SURE YOU:   Understand these instructions.  Will watch your condition.  Will get help right away if you are not doing well or get worse. Document Released: 12/20/2004 Document Revised: 03/29/2014 Document Reviewed: 06/09/2013 [facility] Patient Information 2015 [place], [place]. This information is not intended to replace advice given to you by your health care provider. Make sure you discuss any questions you have with your health care provider.   Emergency Department Resource Guide 1) Find a Doctor and Pay Out of Pocket Although you won't have to find out who is covered by your insurance plan, it is a good idea to ask around and get recommendations. You will then need to call the office and see if the doctor you have chosen will accept you as a new  patient and what types of options they offer for patients who are self-pay. Some doctors offer discounts or will set up payment plans for their patients who do not have insurance, but you will need to ask so you aren't surprised when you get to your appointment.  2) Contact Your Local Health Department Not all health departments have doctors that can see  patients for sick visits, but many do, so it is worth a call to see if yours does. If you don't know where your local health department is, you can check in your phone book. The CDC also has a tool to help you locate your state's health department, and many state websites also have listings of all of their local health departments.  3) Find a Walk-in Clinic If your illness is not likely to be very severe or complicated, you may want to try a walk in clinic. These are popping up all over the country in pharmacies, drugstores, and shopping centers. They're usually staffed by nurse practitioners or physician assistants that have been trained to treat common illnesses and complaints. They're usually fairly quick and inexpensive. However, if you have serious medical issues or chronic medical problems, these are probably not your best option.  No Primary Care Doctor: - Call Health Connect at  [phone] - they can help you locate a primary care doctor that  accepts your insurance, provides certain services, etc. - Physician Referral Service- [phone]  Chronic Pain Problems: Organization         Address  Phone   Notes  [facility]  [phone] Patients need to be referred by their primary care doctor.   Medication Assistance: Organization         Address  Phone   Notes  [facility] Medication [facility] [address] [place] [place], [place] [address] [phone] --Must be a resident of [facility] -- Must have NO insurance coverage whatsoever (no Medicaid/ Medicare, etc.) -- The pt. MUST have a primary care doctor that directs their care regularly and follows them in the community   [facility]  [phone]   [employer]  [phone]    Agencies that provide inexpensive medical care: Organization         Address  Phone   Notes  [facility]  [phone]   [facility]    [phone]   [facility] [address] [place], [place] [address] [phone]   [facility] [address] [place], [place] [phone]   [facility]anned Parenthood    [phone]   [facility]    [phone]   [facility]  [address], [place] Phone:  [phone], Fax:  [phone] Hours of Operation:  9 am - 6 pm, M-F.  Also accepts Medicaid/Medicare and self-pay.  [facility]  [address], [place] Phone: [phone], Fax: [phone]. Hours of Operation:  8:30 am - 5:30 pm, M-F.  Also accepts Medicaid and self-pay.  [facility] [place] [address] [place] Phone: [phone]   [facility] [address] Natasha Bence [place], [place] [phone], Ext. [phone] Mondays & Thursdays: 7-9 AM.  First 15 patients are seen on a first come, first serve basis.    Medicaid-accepting [facility] Providers:  Organization         Address  Phone   Notes  [facility] [address], [place] [phone] Also accepts self-pay patients.  [facility] [address] Laurell Josephs [place], [place]  [phone]   [facility] [address], [place] [phone]   [facility] [address] [place] [phone]   Renaye Rakers [address], [place]   [phone] Only accepts [place] patients after they have their name applied to their card.   Self-Pay (no insurance) in [facility]:  Organization         Address  Phone   Notes  Sickle Cell Patients, [facility] [address] [place], [place] [phone]   [facility] [address] [place], [place] [phone]   [facility] [place]  [address], [place] [phone]   [facility]/Dr. Osei-Bonsu   [address] [place] or [address], [place] [phone] Phone number for both [place] and [place] locations is the same.  Urgent Medical and [facility] [address] [place] [phone]   [facility] [address] [place] or [address] [phone]   [facility] [address] [place] [phone], phone; [phone], fax Sees patients 1st and 3rd Saturday of every month.  Must not qualify for public or private insurance (i.e. Medicaid, Medicare, [place] Health Choice, Veterans' Benefits)  Household income should be no more than 200% of the poverty level The clinic cannot treat you if you are pregnant or think you are pregnant  Sexually transmitted diseases are not treated at the clinic.    Dental Care: Organization         Address  Phone  Notes  [facility] [address] [place], [place] [phone] Accepts children up to age 71 who are enrolled in [place] or [place] Health Choice; pregnant women with a Medicaid card; and children who have applied for Medicaid or [place] Health Choice, but were declined, whose parents can pay a reduced fee at time of service.  [facility] of [facility]  [address], [place] [phone] Accepts children up to age 44 who are enrolled in [place] or [place] Health Choice; pregnant women with a Medicaid card; and children who have applied for Medicaid or  Health Choice, but were declined, whose parents can pay a reduced fee at time of service.  [facility]  [address] [place], [place] [phone] Patients are seen by appointment only. Walk-ins are not accepted. [facility] will see patients 40 years of age and older. Monday - Tuesday (8am-5pm) Most Wednesdays (8:30-5pm) $30 per visit, cash only  [facility] Access  PROGRAM  [address], [facility] [phone] Patients are seen by appointment only. Walk-ins are not accepted. [facility] will see patients 72 years of age and older. One Wednesday Evening (Monthly: Volunteer Based).  $30 per visit, cash only  [employer]  [phone] for adults; Children under age 85, call Graduate Pediatric Dentistry at [phone]. Children aged 17-14, please call [phone] to request a pediatric application.  Dental services are provided in all areas of dental care including fillings, crowns  and bridges, complete and partial dentures, implants, gum treatment, root canals, and extractions. Preventive care is also provided. Treatment is provided to both adults and children. Patients are selected via a lottery and there is often a waiting list.   [facility] [address] [place]  [phone] [email or web address]   [facility] [address] [place], [place] [phone], Ext. [phone] Second and Fourth Thursday of each month, opens at 6:30 AM; Clinic ends at 9 AM.  Patients are seen on a first-come first-served basis, and a limited number are seen during each clinic.   [facility]  [address] Ether Griffins [place], [place] [phone]   Eligibility Requirements You must have lived in [place], [place], or [place] counties for at least the last three months.   You cannot be eligible for state or federal sponsored [place], including [employer], [place], or [employer].   You generally cannot be eligible for healthcare insurance through your employer.    How to apply: Eligibility screenings are held every Tuesday and Wednesday afternoon from 1:00 pm until 4:00 pm. You do not need an appointment for the interview!  [facility] [address] [place], [place] [phone]   [facility]  [phone]   [facility] Health Department   [phone]   [facility] Health Department  [phone]    Behavioral Health Resources in the Community: Intensive Outpatient Programs Organization         Address  Phone  Notes  [facility] [address] [place], [place] [phone]   [facility] Outpatient [address], [place], [place] [phone]   [facility] [address] [place], [place]  [phone]   [facility] [address],  [place], [place] [phone] or [phone]   Substance Abuse Resources Organization         Address  Phone  Notes  [facility]  [phone]   [facility]  [phone]   [facility]  [phone]   Floydene Flock  [phone]   Residential & Outpatient Substance Abuse Program  [phone]   Psychological Services Organization         Address  Phone  Notes  [facility] Behavioral Health  [phone]   [facility]  [phone]   [facility] Mental Health [address], [place] [phone] or [phone]    Mobile Crisis Teams Organization         Address  Phone  Notes  [facility], Mobile Crisis Care Unit  [phone]   [facility]  [address] [place], [place] [phone]   Doristine Locks [address] [place] [phone]    Self-Help/Support Groups Organization         Address  Phone             Notes  [facility] [place] - variety of support groups  336- I7437963 Call for more information  Narcotics Anonymous (NA), [facility] [address], [employer] [place]  2 meetings at this location   Statistician         Address  Phone  Notes  [facility] [address],    [place]  [phone]   [facility]  [address], [place] [address], [place], [place] [phone]   [facility] Facility [address]  [place], [place]  [phone] Admissions: 8am-3pm M-F  Incentives Substance [place] [address]    [place], [place] [phone]   [facility] [address] [place], [place], [place]   [facility] [address],  [place], [place]   [facility] - Intensive Outpatient [address] Kristeen [address], [place], [place]   [facility] [address]  [place], [place] [phone] or [phone]   [facility] ([facility]) [address] [place], [place] Accepts Medicaid  [place] [address],  [place] [phone] Substance Abuse/Addiction Treatment   [facility] Organization         Address  Phone  Notes  [facility]  [phone]   Domenic Schwab, PhD [address] Arlis [place], [place]   [phone] or [phone]   [place], [place] [phone]   [facility] [address] [place], [place] [phone] Insurance/Medicaid/sponsorship through [facility] and Families [address]                                    [place], [place] [phone] [place] [address], [place] [phone]    Dr. Adele Schilder  [phone]   [facility] Dept. 1) [address],  2) [place] 3)  [address], [place] [phone]  [phone]   [place] [phone] or [phone] (After Hours)

## 2015-05-13 NOTE — ED Provider Notes (Signed)
CSN: [id number]     Arrival date & time 05/13/15  2235 History  This chart was scribed for non-physician practitioner Oswaldo Conroy, PA-C working with Lorre Nick, MD by Lyndel Safe, ED Scribe. This patient was seen in room TR06C/TR06C and the patient's care was started at 11:29 PM.   Chief Complaint  Patient presents with  . Otalgia   The history is provided by the patient. No language interpreter was used.   HPI Comments: Alyssa York is a 29 y.o. female, who is [redacted] weeks pregnant, presents to the Emergency Department complaining of a constant, moderate, unchanged right-sided otalgia that began 2 weeks ago. She reports associated dental pain and intermittent right-sided neck pain.The pt was seen in the ED 10 days ago when she was discharged home with amoxicillin which she was compliant with. Denies fevers, chills, nausea, vomiting, ear drainage, HA, decreased hearing, sore throat, cough, congestion, vaginal bleeding/discharge, or abdominal pain. She also denies any pertinent medical history. Pt is followed regularly by an OB GYN.   Past Medical History  Diagnosis Date  . Medical history non-contributory    Past Surgical History  Procedure Laterality Date  . Cesarean section     No family history on file. History  Substance Use Topics  . Smoking status: Former Smoker    Quit date: 03/11/2010  . Smokeless tobacco: Never Used  . Alcohol Use: No   OB History    Gravida Para Term Preterm AB TAB SAB Ectopic Multiple Living   5 3 3  0 0 0 0 0 0 3     Review of Systems  Constitutional: Negative for fever and chills.  HENT: Positive for ear pain. Negative for congestion and ear discharge.   Respiratory: Negative for cough.   Gastrointestinal: Negative for nausea, vomiting and abdominal pain.  Genitourinary: Negative for vaginal bleeding and vaginal discharge.  Neurological: Negative for headaches.   Allergies  Review of patient's allergies indicates no known  allergies.  Home Medications   Prior to Admission medications   Medication Sig Start Date End Date Taking? Authorizing Provider  acetaminophen (TYLENOL) 500 MG tablet Take 1 tablet (500 mg total) by mouth every 6 (six) hours as needed. 05/03/15   Jennifer Piepenbrink, PA-C  amoxicillin (AMOXIL) 500 MG capsule Take 1 capsule (500 mg total) by mouth 3 (three) times daily. 05/03/15   Jennifer Piepenbrink, PA-C  fluconazole (DIFLUCAN) 150 MG tablet Take 1 tablet (150 mg total) by mouth once. 05/12/15   Marlis Edelson, CNM  loratadine (CLARITIN) 10 MG tablet Take 1 tablet (10 mg total) by mouth daily. 05/13/15   Oswaldo Conroy, PA-C  Prenatal Vit-Fe Fumarate-FA (PRENATAL MULTIVITAMIN) TABS tablet Take 1 tablet by mouth daily at 12 noon. 05/03/15   Jennifer Piepenbrink, PA-C   BP 109/69 mmHg  Pulse 84  Temp(Src) 97.9 F (36.6 C)  Resp 18  Ht 5' (1.524 m)  Wt 176 lb 5 oz (79.975 kg)  BMI 34.43 kg/m2  SpO2 97%  LMP 08/31/2014 (Exact Date) Physical Exam  Constitutional: She appears well-developed and well-nourished. No distress.  HENT:  Head: Normocephalic and atraumatic.  Right Ear: External ear normal.  Left Ear: External ear normal.  Mouth/Throat: Oropharynx is clear and moist. No oropharyngeal exudate.  Right and left TM without bulging or extraction. No significant erythema. There is effusion.  No trismus or uvula deviation. Oropharynx clear with mild erythema.   Eyes: Conjunctivae and EOM are normal. Right eye exhibits no discharge. Left eye exhibits no discharge.  Neck:  No mastoid tenderness. No swelling or skin changes. No meningismus.  Cardiovascular: Normal rate and regular rhythm.   Pulmonary/Chest: Effort normal and breath sounds normal. No respiratory distress. She has no wheezes.  Abdominal: Soft. Bowel sounds are normal. She exhibits no distension. There is no tenderness.  Neurological: She is alert. She exhibits normal muscle tone. Coordination normal.  Skin: Skin is warm  and dry. She is not diaphoretic.  Nursing note and vitals reviewed.   ED Course  Procedures  DIAGNOSTIC STUDIES: Oxygen Saturation is 97% on RA, normal by my interpretation.    COORDINATION OF CARE: 11:36 PM Discussed treatment plan with pt. Advised pt to take Claritin OTC and follow up with the list of ED resources given for dental pain. Pt acknowledges and agrees to plan.   Labs Review Labs Reviewed - No data to display  Imaging Review No results found.   EKG Interpretation None      MDM   Final diagnoses:  Otalgia, right  Otitis media with effusion, right   Patient presenting with persistent earache. No fevers. Exam consistent with otitis media with effusion. No evidence of infection. Patient treated with Claritin and follow-up with ENT for persistent pain. Patient also to follow-up with dentist. ED resources provided. 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  Discussed return precautions with patient. Discussed all results and patient verbalizes understanding and agrees with plan.  I personally performed the services described in this documentation, which was scribed in my presence. The recorded information has been reviewed and is accurate.    Oswaldo Conroy, PA-C 05/14/15 0057  Lorre Nick, MD 05/14/15 2130

## 2015-05-13 NOTE — ED Notes (Signed)
The pt has had a earache  On the rt for 2 weeks.  She was seen here and given mned but it is no better.  37 weeks preg

## 2015-05-13 NOTE — ED Notes (Signed)
PT st's she was here 2 weeks ago and was given Amoxicillin for right ear infection.  Pt st's she has taken all the Amoxicillin and is not any better

## 2015-05-19 ENCOUNTER — Ambulatory Visit (INDEPENDENT_AMBULATORY_CARE_PROVIDER_SITE_OTHER): Payer: Medicaid Other | Admitting: Obstetrics & Gynecology

## 2015-05-19 VITALS — BP 113/70 | HR 105 | Temp 98.7°F | Wt 171.7 lb

## 2015-05-19 DIAGNOSIS — O3421 Maternal care for scar from previous cesarean delivery: Secondary | ICD-10-CM | POA: Diagnosis not present

## 2015-05-19 DIAGNOSIS — O0933 Supervision of pregnancy with insufficient antenatal care, third trimester: Secondary | ICD-10-CM | POA: Diagnosis not present

## 2015-05-19 DIAGNOSIS — H9201 Otalgia, right ear: Secondary | ICD-10-CM

## 2015-05-19 LAB — POCT URINALYSIS DIP (DEVICE)
Bilirubin Urine: NEGATIVE
Glucose, UA: NEGATIVE mg/dL
Ketones, ur: NEGATIVE mg/dL
NITRITE: NEGATIVE
PH: 7 (ref 5.0–8.0)
Protein, ur: NEGATIVE mg/dL
Specific Gravity, Urine: 1.015 (ref 1.005–1.030)
Urobilinogen, UA: 0.2 mg/dL (ref 0.0–1.0)

## 2015-05-19 NOTE — Progress Notes (Signed)
Right ear pain radiates down neck, took claritin and antibiotics  Subjective:cc as above  Alyssa York is a 29 y.o. G5P3003 at [redacted]w[redacted]d being seen today for ongoing prenatal care.  Patient reports ear pain.  Contractions: Not present.  Vag. Bleeding: None. Movement: Present. Denies leaking of fluid.   The following portions of the patient's history were reviewed and updated as appropriate: allergies, current medications, past family history, past medical history, past social history, past surgical history and problem list.   Objective:   Filed Vitals:   05/19/15 1050  BP: 113/70  Pulse: 105  Temp: 98.7 F (37.1 C)  Weight: 171 lb 11.2 oz (77.883 kg)    Fetal Status: Fetal Heart Rate (bpm): 138   Movement: Present    Neck supple  .ear TM and canal nl right General:  Alert, oriented and cooperative. Patient is in no acute distress.  Skin: Skin is warm and dry. No rash noted.   Cardiovascular: Normal heart rate noted  Respiratory: Effort  normal, no problems with respiration noted  Abdomen: Soft, gravid, appropriate for gestational age. Pain/Pressure: Absent     Vaginal: Vag. Bleeding: None.       Cervix: Not evaluated        Extremities: Normal range of motion.  Edema: None  Mental Status: Normal mood and affect. Normal behavior. Normal judgment and thought content.   Urinalysis:      Assessment and Plan:  Pregnancy: G5P3003 at [redacted]w[redacted]d  Late prenatal care affecting pregnancy in third trimester     Term labor symptoms and general obstetric precautions including but not limited to vaginal bleeding, contractions, leaking of fluid and fetal movement were reviewed in detail with the patient. ENT referral Please refer to After Visit Summary for other counseling recommendations.   1 week f/u Fioricet prn Adam Phenix, MD

## 2015-05-19 NOTE — Progress Notes (Signed)
Physician requested referral to ENT.  Patient has Medicaid [place] Access.  Informed patient she would need to contact Dr. Elsie Stain office as he is listed as her primary care provider.  Explained her other choice would be to call her Medicaid worker and have the primary care provider changed to someone else.  I explained Medicaid will not allow [place] to make the referral.  Patient states understanding.

## 2015-05-19 NOTE — Patient Instructions (Signed)

## 2015-05-20 MED ORDER — BUTALBITAL-APAP-CAFFEINE 50-500-40 MG PO TABS: 1.0000 | ORAL_TABLET | ORAL | Status: DC | PRN

## 2015-05-26 ENCOUNTER — Encounter: Payer: Medicaid Other | Admitting: Family Medicine

## 2015-05-29 ENCOUNTER — Encounter (HOSPITAL_COMMUNITY): Payer: Self-pay | Admitting: *Deleted

## 2015-05-29 ENCOUNTER — Inpatient Hospital Stay (EMERGENCY_DEPARTMENT_HOSPITAL)
Admission: AD | Admit: 2015-05-29 | Discharge: 2015-05-29 | Disposition: A | Discharge status: Home / Self Care | Payer: Medicaid Other | Source: Ambulatory Visit | Attending: Obstetrics & Gynecology | Admitting: Obstetrics & Gynecology

## 2015-05-29 DIAGNOSIS — N898 Other specified noninflammatory disorders of vagina: Secondary | ICD-10-CM

## 2015-05-29 DIAGNOSIS — O3421 Maternal care for scar from previous cesarean delivery: Principal | ICD-10-CM | POA: Diagnosis present

## 2015-05-29 DIAGNOSIS — Z3A38 38 weeks gestation of pregnancy: Secondary | ICD-10-CM | POA: Diagnosis not present

## 2015-05-29 DIAGNOSIS — O26893 Other specified pregnancy related conditions, third trimester: Secondary | ICD-10-CM | POA: Diagnosis not present

## 2015-05-29 LAB — POCT FERN TEST: POCT Fern Test: NEGATIVE

## 2015-05-29 NOTE — MAU Note (Signed)
Pt states she has been feeling contractions every 8-10 mins all day and some lower abdominal pressure. States some LOF but no vag bleeding. +FM.

## 2015-05-29 NOTE — Discharge Instructions (Signed)
Third Trimester of Pregnancy The third trimester is from week 29 through week 42, months 7 through 9. The third trimester is a time when the fetus is growing rapidly. At the end of the ninth month, the fetus is about 20 inches in length and weighs 6-10 pounds.  BODY CHANGES Your body goes through many changes during pregnancy. The changes vary from woman to woman.   Your weight will continue to increase. You can expect to gain 25-35 pounds (11-16 kg) by the end of the pregnancy.  You may begin to get stretch marks on your hips, abdomen, and breasts.  You may urinate more often because the fetus is moving lower into your pelvis and pressing on your bladder.  You may develop or continue to have heartburn as a result of your pregnancy.  You may develop constipation because certain hormones are causing the muscles that push waste through your intestines to slow down.  You may develop hemorrhoids or swollen, bulging veins (varicose veins).  You may have pelvic pain because of the weight gain and pregnancy hormones relaxing your joints between the bones in your pelvis. Backaches may result from overexertion of the muscles supporting your posture.  You may have changes in your hair. These can include thickening of your hair, rapid growth, and changes in texture. Some women also have hair loss during or after pregnancy, or hair that feels dry or thin. Your hair will most likely return to normal after your baby is born.  Your breasts will continue to grow and be tender. A yellow discharge may leak from your breasts called colostrum.  Your belly button may stick out.  You may feel short of breath because of your expanding uterus.  You may notice the fetus "dropping," or moving lower in your abdomen.  You may have a bloody mucus discharge. This usually occurs a few days to a week before labor begins.  Your cervix becomes thin and soft (effaced) near your due date. WHAT TO EXPECT AT YOUR PRENATAL  EXAMS  You will have prenatal exams every 2 weeks until week 36. Then, you will have weekly prenatal exams. During a routine prenatal visit:  You will be weighed to make sure you and the fetus are growing normally.  Your blood pressure is taken.  Your abdomen will be measured to track your baby's growth.  The fetal heartbeat will be listened to.  Any test results from the previous visit will be discussed.  You may have a cervical check near your due date to see if you have effaced. At around 36 weeks, your caregiver will check your cervix. At the same time, your caregiver will also perform a test on the secretions of the vaginal tissue. This test is to determine if a type of bacteria, Group B streptococcus, is present. Your caregiver will explain this further. Your caregiver may ask you:  What your birth plan is.  How you are feeling.  If you are feeling the baby move.  If you have had any abnormal symptoms, such as leaking fluid, bleeding, severe headaches, or abdominal cramping.  If you have any questions. Other tests or screenings that may be performed during your third trimester include:  Blood tests that check for low iron levels (anemia).  Fetal testing to check the health, activity level, and growth of the fetus. Testing is done if you have certain medical conditions or if there are problems during the pregnancy. FALSE LABOR You may feel small, irregular contractions that   eventually go away. These are called Braxton Hicks contractions, or false labor. Contractions may last for hours, days, or even weeks before true labor sets in. If contractions come at regular intervals, intensify, or become painful, it is best to be seen by your caregiver.  SIGNS OF LABOR   Menstrual-like cramps.  Contractions that are 5 minutes apart or less.  Contractions that start on the top of the uterus and spread down to the lower abdomen and back.  A sense of increased pelvic pressure or back  pain.  A watery or bloody mucus discharge that comes from the vagina. If you have any of these signs before the 37th week of pregnancy, call your caregiver right away. You need to go to the hospital to get checked immediately. HOME CARE INSTRUCTIONS   Avoid all smoking, herbs, alcohol, and unprescribed drugs. These chemicals affect the formation and growth of the baby.  Follow your caregiver's instructions regarding medicine use. There are medicines that are either safe or unsafe to take during pregnancy.  Exercise only as directed by your caregiver. Experiencing uterine cramps is a good sign to stop exercising.  Continue to eat regular, healthy meals.  Wear a good support bra for breast tenderness.  Do not use hot tubs, steam rooms, or saunas.  Wear your seat belt at all times when driving.  Avoid raw meat, uncooked cheese, cat litter boxes, and soil used by cats. These carry germs that can cause birth defects in the baby.  Take your prenatal vitamins.  Try taking a stool softener (if your caregiver approves) if you develop constipation. Eat more high-fiber foods, such as fresh vegetables or fruit and whole grains. Drink plenty of fluids to keep your urine clear or pale yellow.  Take warm sitz baths to soothe any pain or discomfort caused by hemorrhoids. Use hemorrhoid cream if your caregiver approves.  If you develop varicose veins, wear support hose. Elevate your feet for 15 minutes, 3-4 times a day. Limit salt in your diet.  Avoid heavy lifting, wear low heal shoes, and practice good posture.  Rest a lot with your legs elevated if you have leg cramps or low back pain.  Visit your dentist if you have not gone during your pregnancy. Use a soft toothbrush to brush your teeth and be gentle when you floss.  A sexual relationship may be continued unless your caregiver directs you otherwise.  Do not travel far distances unless it is absolutely necessary and only with the approval  of your caregiver.  Take prenatal classes to understand, practice, and ask questions about the labor and delivery.  Make a trial run to the hospital.  Pack your hospital bag.  Prepare the baby's nursery.  Continue to go to all your prenatal visits as directed by your caregiver. SEEK MEDICAL CARE IF:  You are unsure if you are in labor or if your water has broken.  You have dizziness.  You have mild pelvic cramps, pelvic pressure, or nagging pain in your abdominal area.  You have persistent nausea, vomiting, or diarrhea.  You have a bad smelling vaginal discharge.  You have pain with urination. SEEK IMMEDIATE MEDICAL CARE IF:   You have a fever.  You are leaking fluid from your vagina.  You have spotting or bleeding from your vagina.  You have severe abdominal cramping or pain.  You have rapid weight loss or gain.  You have shortness of breath with chest pain.  You notice sudden or extreme swelling   of your face, hands, ankles, feet, or legs.  You have not felt your baby move in over an hour.  You have severe headaches that do not go away with medicine.  You have vision changes. Document Released: 11/06/2001 Document Revised: 11/17/2013 Document Reviewed: 01/13/2013 Exit[employer] Patient Information 2015 [employer]. This information is not intended to replace advice given to you by your health care provider. Make sure you discuss any questions you have with your health care provider.  

## 2015-05-29 NOTE — MAU Provider Note (Signed)
History     CSN: [id number]  Arrival date and time: 05/29/15 2113   First Provider Initiated Contact with Patient 05/29/15 2233      No chief complaint on file.  HPI   Patient is 29 y.o. [id number] [redacted]w[redacted]d here with complaints of LOF all day today.  Thin, clear liquid, small amounts.   Reports UCs q8-10 minutes, mild, intermittent. +FM, denies VB, atypical vaginal discharge.    OB History    Gravida Para Term Preterm AB TAB SAB Ectopic Multiple Living   0 0 0 0 0 0 3      Past Medical History  Diagnosis Date  . Medical history non-contributory     Past Surgical History  Procedure Laterality Date  . Cesarean section      History reviewed. No pertinent family history.  History  Substance Use Topics  . Smoking status: Former Smoker    Quit date: 03/11/2010  . Smokeless tobacco: Never Used  . Alcohol Use: No    Allergies: No Known Allergies  Prescriptions prior to admission  Medication Sig Dispense Refill Last Dose  . acetaminophen (TYLENOL) 500 MG tablet Take 1 tablet (500 mg total) by mouth every 6 (six) hours as needed. (Patient taking differently: Take 500 mg by mouth every 6 (six) hours as needed for mild pain or headache. ) 30 tablet 0 05/28/2015 at 2230  . loratadine (CLARITIN) 10 MG tablet Take 1 tablet (10 mg total) by mouth daily. 20 tablet 0 Past Week at Unknown time  . Prenatal Vit-Fe Fumarate-FA (PRENATAL MULTIVITAMIN) TABS tablet Take 1 tablet by mouth daily at 12 noon. 30 tablet 0 Past Week at Unknown time  . butalbital-acetaminophen-caffeine (ESGIC PLUS) 50-500-40 MG per tablet Take 1-2 tablets by mouth every 4 (four) hours as needed for pain or headache. (Patient not taking: Reported on 05/29/2015) 12 tablet 0     Review of Systems  Constitutional: Negative for fever and chills.  HENT: Negative for congestion.   Eyes: Negative for blurred vision and double vision.  Respiratory: Negative for cough and shortness of breath.   Cardiovascular: Negative  for chest pain, palpitations and leg swelling.  Gastrointestinal: Negative for heartburn, nausea and vomiting.  Genitourinary: Negative for dysuria, urgency and frequency.  Musculoskeletal: Negative.   Skin: Negative for itching and rash.  Neurological: Negative.  Negative for headaches.  Endo/Heme/Allergies: Negative.   Psychiatric/Behavioral: Negative.    Physical Exam   Blood pressure 115/79, pulse 96, temperature 98.6 F (37 C), temperature source Oral, resp. rate 16, height 5' (1.524 m), weight 177 lb (80.287 kg), last menstrual period 08/31/2014, SpO2 99 %, unknown if currently breastfeeding.  Physical Exam  Constitutional: She is oriented to person, place, and time. She appears well-developed and well-nourished. No distress.  HENT:  Head: Normocephalic and atraumatic.  Eyes: EOM are normal. No scleral icterus.  Neck: Normal range of motion. Neck supple.  Cardiovascular: Normal rate, regular rhythm and intact distal pulses.   Respiratory: Effort normal. No respiratory distress.  GI: There is no tenderness.  gravid  Genitourinary: Vagina normal.  GYN:  External genitalia within normal limits.  Vaginal mucosa pink, moist, normal rugae.  Nonfriable cervix without lesions, no pooling or bleeding noted on speculum exam.  Cervix: 2/50/ballotable, vertex   Musculoskeletal: She exhibits no edema or tenderness.  Neurological: She is alert and oriented to person, place, and time. No cranial nerve deficit.  Skin: Skin is warm and dry. No rash noted.  Psychiatric: She has  a normal mood and affect. Her behavior is normal.    MAU Course  Procedures  MDM  FHR: Cat 1, reactive, +accels, reassuring Toco: Uterine irritabilty Fern: Negative No evidence of ROM  Assessment and Plan  Patient is 29 y.o. [id number] 5432w5d reporting LOF likely secondary to physiologic vaginal discharge. - reassured patient - fetal kick counts reinforced - labor precautions - discharge home [facility]  [facility] scheduled [facility]t   Erasmo DownerAngela M Bacigalupo, MD, MPH PGY-2,  [facility] 05/29/2015 10:51 PM   OB fellow attestation:  I have seen and examined this patient; I agree with above documentation in the resident's note.   Rubie MaidQuante Alves is a 29 y.o. [id number] reporting LOF +FM, denies VB, contractions, vaginal discharge.  PE: BP 103/64 mmHg  Pulse 94  Temp(Src) 98.6 F (37 C) (Oral)  Resp 16  Ht 5' (1.524 m)  Wt 177 lb (80.287 kg)  BMI 34.57 kg/m2  SpO2 99%  LMP 08/31/2014 (Exact Date) Gen: calm comfortable, NAD Resp: normal effort, no distress Abd: gravid  ROS, labs, PMH reviewed NST reactive  Plan: - fetal kick counts reinforced, labor precautions - continue routine follow up in OB clinic  Perry MountACOSTA,Baldo Hufnagle ROCIO, MD 4:12 PM

## 2015-05-29 NOTE — MAU Note (Signed)
Pt states she has been contracting all day, now about 3-4 contractions per hour, also having a lot of pressure in her lower abd when she walks. Also ? Leaking fluid "for a few days"

## 2015-05-30 ENCOUNTER — Encounter (HOSPITAL_COMMUNITY): Payer: Self-pay | Admitting: *Deleted

## 2015-05-30 ENCOUNTER — Inpatient Hospital Stay (HOSPITAL_COMMUNITY)
Admission: AD | Admit: 2015-05-30 | Discharge: 2015-06-01 | DRG: 775 | Disposition: A | Discharge status: Home / Self Care | Payer: Medicaid Other | Source: Ambulatory Visit | Attending: Obstetrics & Gynecology | Admitting: Obstetrics & Gynecology

## 2015-05-30 ENCOUNTER — Inpatient Hospital Stay (HOSPITAL_COMMUNITY): Payer: Medicaid Other | Admitting: Anesthesiology

## 2015-05-30 DIAGNOSIS — O3421 Maternal care for scar from previous cesarean delivery: Secondary | ICD-10-CM | POA: Diagnosis present

## 2015-05-30 DIAGNOSIS — Z3483 Encounter for supervision of other normal pregnancy, third trimester: Secondary | ICD-10-CM | POA: Diagnosis present

## 2015-05-30 DIAGNOSIS — Z3A38 38 weeks gestation of pregnancy: Secondary | ICD-10-CM | POA: Diagnosis present

## 2015-05-30 DIAGNOSIS — O0933 Supervision of pregnancy with insufficient antenatal care, third trimester: Secondary | ICD-10-CM | POA: Diagnosis not present

## 2015-05-30 DIAGNOSIS — IMO0001 Reserved for inherently not codable concepts without codable children: Secondary | ICD-10-CM

## 2015-05-30 LAB — CBC
HEMATOCRIT: 36.6 % (ref 36.0–46.0)
HEMOGLOBIN: 12.4 g/dL (ref 12.0–15.0)
MCH: 29.6 pg (ref 26.0–34.0)
MCHC: 33.9 g/dL (ref 30.0–36.0)
MCV: 87.4 fL (ref 78.0–100.0)
Platelets: 179 10*3/uL (ref 150–400)
RBC: 4.19 MIL/uL (ref 3.87–5.11)
RDW: 13.8 % (ref 11.5–15.5)
WBC: 8.6 10*3/uL (ref 4.0–10.5)

## 2015-05-30 LAB — RPR: RPR Ser Ql: NONREACTIVE

## 2015-05-30 LAB — TYPE AND SCREEN
ABO/RH(D): B POS
Antibody Screen: NEGATIVE

## 2015-05-30 MED ORDER — OXYCODONE-ACETAMINOPHEN 5-325 MG PO TABS
1.0000 | ORAL_TABLET | ORAL | Status: DC | PRN
  Administered 2015-05-30 – 2015-06-01 (×2): 1 via ORAL
  Filled 2015-05-30 (×2): qty 1

## 2015-05-30 MED ORDER — TETANUS-DIPHTH-ACELL PERTUSSIS 5-2.5-18.5 LF-MCG/0.5 IM SUSP: 0.5000 mL | Freq: Once | INTRAMUSCULAR | Status: DC

## 2015-05-30 MED ORDER — OXYCODONE-ACETAMINOPHEN 5-325 MG PO TABS: 2.0000 | ORAL_TABLET | ORAL | Status: DC | PRN

## 2015-05-30 MED ORDER — SODIUM CHLORIDE 0.9 % IV SOLN: 14.0000 mL/h | INTRAVENOUS | Status: DC | PRN

## 2015-05-30 MED ORDER — FLEET ENEMA 7-19 GM/118ML RE ENEM: 1.0000 | ENEMA | RECTAL | Status: DC | PRN

## 2015-05-30 MED ORDER — IBUPROFEN 600 MG PO TABS
600.0000 mg | ORAL_TABLET | Freq: Four times a day (QID) | ORAL | Status: DC
  Administered 2015-05-30 – 2015-06-01 (×10): 600 mg via ORAL
  Filled 2015-05-30 (×9): qty 1

## 2015-05-30 MED ORDER — LANOLIN HYDROUS EX OINT: TOPICAL_OINTMENT | CUTANEOUS | Status: DC | PRN

## 2015-05-30 MED ORDER — FENTANYL 2.5 MCG/ML BUPIVACAINE 1/10 % EPIDURAL INFUSION (WH - ANES)
14.0000 mL/h | INTRAMUSCULAR | Status: DC | PRN
  Administered 2015-05-30: 14 mL/h via EPIDURAL
  Administered 2015-05-30: 12 mL/h via EPIDURAL
  Filled 2015-05-30: qty 125

## 2015-05-30 MED ORDER — ONDANSETRON HCL 4 MG PO TABS: 4.0000 mg | ORAL_TABLET | ORAL | Status: DC | PRN

## 2015-05-30 MED ORDER — DIPHENHYDRAMINE HCL 50 MG/ML IJ SOLN: 12.5000 mg | INTRAMUSCULAR | Status: DC | PRN

## 2015-05-30 MED ORDER — LIDOCAINE-EPINEPHRINE (PF) 2 %-1:200000 IJ SOLN
INTRAMUSCULAR | Status: DC | PRN
  Administered 2015-05-30: 5 mL

## 2015-05-30 MED ORDER — BENZOCAINE-MENTHOL 20-0.5 % EX AERO
1.0000 "application " | INHALATION_SPRAY | CUTANEOUS | Status: DC | PRN
  Administered 2015-05-30: 1 via TOPICAL
  Filled 2015-05-30: qty 56

## 2015-05-30 MED ORDER — OXYCODONE-ACETAMINOPHEN 5-325 MG PO TABS: 1.0000 | ORAL_TABLET | ORAL | Status: DC | PRN

## 2015-05-30 MED ORDER — ONDANSETRON HCL 4 MG/2ML IJ SOLN: 4.0000 mg | INTRAMUSCULAR | Status: DC | PRN

## 2015-05-30 MED ORDER — CITRIC ACID-SODIUM CITRATE 334-500 MG/5ML PO SOLN: 30.0000 mL | ORAL | Status: DC | PRN

## 2015-05-30 MED ORDER — LACTATED RINGERS IV SOLN
INTRAVENOUS | Status: DC
  Administered 2015-05-30: 500 mL via INTRAVENOUS

## 2015-05-30 MED ORDER — SIMETHICONE 80 MG PO CHEW: 80.0000 mg | CHEWABLE_TABLET | ORAL | Status: DC | PRN

## 2015-05-30 MED ORDER — DIPHENHYDRAMINE HCL 25 MG PO CAPS: 25.0000 mg | ORAL_CAPSULE | Freq: Four times a day (QID) | ORAL | Status: DC | PRN

## 2015-05-30 MED ORDER — BUPIVACAINE HCL (PF) 0.25 % IJ SOLN
INTRAMUSCULAR | Status: DC | PRN
  Administered 2015-05-30 (×2): 4 mL

## 2015-05-30 MED ORDER — ONDANSETRON HCL 4 MG/2ML IJ SOLN: 4.0000 mg | Freq: Four times a day (QID) | INTRAMUSCULAR | Status: DC | PRN

## 2015-05-30 MED ORDER — PHENYLEPHRINE 40 MCG/ML (10ML) SYRINGE FOR IV PUSH (FOR BLOOD PRESSURE SUPPORT)
80.0000 ug | PREFILLED_SYRINGE | INTRAVENOUS | Status: DC | PRN
  Filled 2015-05-30: qty 2
  Filled 2015-05-30: qty 20

## 2015-05-30 MED ORDER — ACETAMINOPHEN 325 MG PO TABS: 650.0000 mg | ORAL_TABLET | ORAL | Status: DC | PRN

## 2015-05-30 MED ORDER — ACETAMINOPHEN 325 MG PO TABS
650.0000 mg | ORAL_TABLET | ORAL | Status: DC | PRN
  Administered 2015-05-31 (×2): 650 mg via ORAL
  Filled 2015-05-30 (×2): qty 2

## 2015-05-30 MED ORDER — ZOLPIDEM TARTRATE 5 MG PO TABS: 5.0000 mg | ORAL_TABLET | Freq: Every evening | ORAL | Status: DC | PRN

## 2015-05-30 MED ORDER — LACTATED RINGERS IV SOLN: 500.0000 mL | INTRAVENOUS | Status: DC | PRN

## 2015-05-30 MED ORDER — LIDOCAINE HCL (PF) 1 % IJ SOLN
30.0000 mL | INTRAMUSCULAR | Status: DC | PRN
  Filled 2015-05-30: qty 30

## 2015-05-30 MED ORDER — OXYTOCIN 40 UNITS IN LACTATED RINGERS INFUSION - SIMPLE MED
62.5000 mL/h | INTRAVENOUS | Status: DC
  Filled 2015-05-30: qty 1000

## 2015-05-30 MED ORDER — WITCH HAZEL-GLYCERIN EX PADS: 1.0000 "application " | MEDICATED_PAD | CUTANEOUS | Status: DC | PRN

## 2015-05-30 MED ORDER — EPHEDRINE 5 MG/ML INJ
10.0000 mg | INTRAVENOUS | Status: DC | PRN
  Filled 2015-05-30: qty 2

## 2015-05-30 MED ORDER — SENNOSIDES-DOCUSATE SODIUM 8.6-50 MG PO TABS
2.0000 | ORAL_TABLET | ORAL | Status: DC
  Administered 2015-05-30 – 2015-05-31 (×2): 2 via ORAL
  Filled 2015-05-30 (×2): qty 2

## 2015-05-30 MED ORDER — PRENATAL MULTIVITAMIN CH
1.0000 | ORAL_TABLET | Freq: Every day | ORAL | Status: DC
  Administered 2015-05-30 – 2015-06-01 (×3): 1 via ORAL
  Filled 2015-05-30 (×2): qty 1

## 2015-05-30 MED ORDER — DIBUCAINE 1 % RE OINT: 1.0000 "application " | TOPICAL_OINTMENT | RECTAL | Status: DC | PRN

## 2015-05-30 MED ORDER — OXYTOCIN BOLUS FROM INFUSION
500.0000 mL | INTRAVENOUS | Status: DC
  Administered 2015-05-30: 500 mL via INTRAVENOUS

## 2015-05-30 NOTE — MAU Note (Signed)
To be admitted to L&D

## 2015-05-30 NOTE — Lactation Note (Signed)
This note was copied from the chart of Alyssa York Arts. Lactation Consultation Note  Initial visit made.  Breastfeeding consultation services and support information given to mom.  This is her fourth baby and she breastfed her previous babies without difficulty.  Instructed to feed with any feeding cue but at least attempt every 3 hours.  Baby has latched but sleepy skin to skin with mom now.  Encouraged to call with concern/assist prn.  Patient Name: Alyssa York Tumminello JXBJY'NToday's Date: 05/30/2015 Reason for consult: Initial assessment   Maternal Data    Feeding    LATCH Score/Interventions                      Lactation Tools Discussed/Used     Consult Status Consult Status: Follow-up Date: 05/31/15 Follow-up type: In-patient    Huston FoleyMOULDEN, Schyler Butikofer S 05/30/2015, 1:30 PM

## 2015-05-30 NOTE — MAU Note (Signed)
Received pt via EMS, saline lock in l antecubital site, IV L/R to be started. 18gauge in place, no redness, swelling noted.

## 2015-05-30 NOTE — Anesthesia Procedure Notes (Signed)
Epidural Patient location during procedure: OB  Staffing Anesthesiologist: Konnie Noffsinger, CHRIS Performed by: anesthesiologist   Preanesthetic Checklist Completed: patient identified, surgical consent, pre-op evaluation, timeout performed, IV checked, risks and benefits discussed and monitors and equipment checked  Epidural Patient position: sitting Prep: site prepped and draped and DuraPrep Patient monitoring: heart rate, cardiac monitor, continuous pulse ox and blood pressure Approach: midline Location: L5-S1 Injection technique: LOR saline  Needle:  Needle type: Tuohy  Needle gauge: 17 G Needle length: 9 cm Needle insertion depth: 6 cm Catheter type: closed end flexible Catheter size: 19 Gauge Catheter at skin depth: 12 cm Test dose: negative and 2% lidocaine with Epi 1:200 K  Assessment Events: blood not aspirated, injection not painful, no injection resistance, negative IV test and no paresthesia  Additional Notes H+P and labs checked, risks and benefits discussed with the patient, consent obtained, procedure tolerated well and without complications.  Reason for block:procedure for pain

## 2015-05-30 NOTE — Anesthesia Preprocedure Evaluation (Signed)
Anesthesia Evaluation  Patient identified by MRN, date of birth, ID band Patient awake    Reviewed: Allergy & Precautions, Patient's Chart, lab work & pertinent test results, reviewed documented beta blocker date and time   History of Anesthesia Complications Negative for: history of anesthetic complications  Airway Mallampati: II  TM Distance: >3 FB Neck ROM: Full    Dental  (+) Teeth Intact   Pulmonary neg pulmonary ROS, former smoker,  breath sounds clear to auscultation        Cardiovascular negative cardio ROS  Rhythm:Regular     Neuro/Psych negative neurological ROS  negative psych ROS   GI/Hepatic negative GI ROS, Neg liver ROS,   Endo/Other  negative endocrine ROS  Renal/GU negative Renal ROS     Musculoskeletal   Abdominal   Peds  Hematology negative hematology ROS (+)   Anesthesia Other Findings   Reproductive/Obstetrics (+) Pregnancy                             Anesthesia Physical Anesthesia Plan  ASA: II  Anesthesia Plan: Epidural   Post-op Pain Management:    Induction:   Airway Management Planned:   Additional Equipment:   Intra-op Plan:   Post-operative Plan:   Informed Consent: I have reviewed the patients History and Physical, chart, labs and discussed the procedure including the risks, benefits and alternatives for the proposed anesthesia with the patient or authorized representative who has indicated his/her understanding and acceptance.   Dental advisory given  Plan Discussed with: Anesthesiologist  Anesthesia Plan Comments:         Anesthesia Quick Evaluation

## 2015-05-30 NOTE — Anesthesia Postprocedure Evaluation (Signed)
Anesthesia Post Note  Patient: Alyssa York  Procedure(s) Performed: * No procedures listed *  Anesthesia type: Epidural  Patient location: Mother/Baby  Post pain: Pain level controlled  Post assessment: Post-op Vital signs reviewed  Last Vitals:  Filed Vitals:   05/30/15 1200  BP: 121/60  Pulse: 75  Temp: 36.5 C  Resp: 20    Post vital signs: Reviewed  Level of consciousness:alert  Complications: No apparent anesthesia complications

## 2015-05-30 NOTE — H&P (Signed)
Alyssa York is a 29 y.o. female [phone] at 3037w6d by LMP and 35wk US presenting for SOL.  She reports regular, painful contractions q1-2 min x1hr.  She was evaluated in AMU earlier this evening for LOF, and found to have no evidence of ROM.  Desires TOLAC - has had 2 previously successful VBACs.  +FM, denies VB, LOF, vaginal discharge.  Boy/br and bottle/BTL.   Clinic  [facility] Prenatal Labs  Dating  LMP, consistent w 35 wk ultrasound Blood type: B+  Genetic Screen 1 Screen:    AFP:     Quad:     NIPS: Antibody: Neg  Anatomic US  Normal at 35 wks Rubella: Immunie  GTT Early:               Third trimester: 57 RPR: Nonreactive  Flu vaccine  Not given HBsAg: neg  TDaP vaccine   05/05/15                                            Rhogam: HIV: Nonreactive  GBS   Neg                                           (For PCN allergy, check sensitivities) GBS:  Contraception  BTL, consent signed 05/05/15 Pap:  Neg; GC/CT neg x 2  Baby Food  Breast   Circumcision  Outpatient circk   Pediatrician  [facility]   Support Person  Justin ([facility])        Maternal Medical History:  Reason for admission: Contractions.   Contractions: Onset was 1-2 hours ago.   Frequency: regular.   Perceived severity is strong.    Fetal activity: Perceived fetal activity is normal.   Last perceived fetal movement was within the past hour.      OB History    Gravida Para Term Preterm AB TAB SAB Ectopic Multiple Living   5 3 3  0 0 0 0 0 0 3     Past Medical History  Diagnosis Date  . Medical history non-contributory    Past Surgical History  Procedure Laterality Date  . Cesarean section     Family History: family history is not on file. Social History:  reports that she quit smoking about 5 years ago. She has never used smokeless tobacco. She reports that she does not drink alcohol or use illicit drugs.   Prenatal Transfer Tool  Maternal Diabetes: No Genetic Screening: Declined Maternal  Ultrasounds/Referrals: Normal Fetal Ultrasounds or other Referrals:  None Maternal Substance Abuse:  No Significant Maternal Medications:  None Significant Maternal Lab Results:  Lab values include: Group B Strep negative Other Comments:  late to prenatal care, at 35wks  Review of Systems  Constitutional: Negative for fever, chills and weight loss.  HENT: Negative for congestion and ear pain.   Eyes: Negative for blurred vision, double vision, photophobia and pain.  Respiratory: Negative for cough, shortness of breath and wheezing.   Cardiovascular: Negative for chest pain and leg swelling.  Gastrointestinal: Negative for heartburn, vomiting and abdominal pain.  Genitourinary: Negative for dysuria, urgency, frequency and hematuria.  Musculoskeletal: Negative.   Skin: Negative for itching and rash.  Neurological: Negative.  Negative for headaches.  Endo/Heme/Allergies: Negative.   Psychiatric/Behavioral: Negative.  Last menstrual period 08/31/2014, unknown if currently breastfeeding. Maternal Exam:  Uterine Assessment: Contraction strength is firm.  Contraction frequency is regular.   Abdomen: Patient reports no abdominal tenderness. Fetal presentation: vertex  Introitus: Normal vulva. Normal vagina.  Ferning test: not done.  Amniotic fluid character: not assessed.  Pelvis: adequate for delivery.   Cervix: Cervix evaluated by digital exam.     Fetal Exam Fetal Monitor Review: Baseline rate: 140.  Variability: moderate (6-25 bpm).   Pattern: accelerations present and no decelerations.    Fetal State Assessment: Category I - tracings are normal.     Physical Exam  Constitutional: She is oriented to person, place, and time. She appears well-developed and well-nourished. No distress.  HENT:  Head: Normocephalic and atraumatic.  Eyes: EOM are normal. Pupils are equal, round, and reactive to light.  Neck: Normal range of motion. Neck supple.  Cardiovascular: Normal  rate, regular rhythm and intact distal pulses.   No murmur heard. Respiratory: Effort normal and breath sounds normal. No respiratory distress. She has no wheezes.  GI: Soft. There is no tenderness.  Musculoskeletal: She exhibits no edema or tenderness.  Neurological: She is alert and oriented to person, place, and time.  Skin: Skin is warm and dry. No rash noted.  Psychiatric: She has a normal mood and affect. Her behavior is normal. Thought content normal.    Prenatal labs: ABO, Rh: B/POS/-- (06/09 1219) Antibody: NEG (06/09 1219) Rubella: 2.48 (06/09 1219) RPR: NON REAC (06/09 1219)  HBsAg: NEGATIVE (06/09 1219)  HIV: NONREACTIVE (06/09 1219)  GBS: Negative (06/09 0000)   Assessment/Plan: [name] is a 29 y.o. G5P3003 at [redacted]w[redacted]d here forSOL.  #Labor: expectant management #Pain: May have epidural upon request #FWB: Cat 1  #ID:  GBS neg #MOF: breast and bottle #MOC: BTL #Circ:  outpt circ    Erasmo Downer, MD, MPH PGY-2,  [facility] 05/30/2015 2:22 AM   OB fellow attestation:  I have seen and examined this patient; I agree with above documentation in the resident's note.   [name] is a 29 y.o. [id number] here for SOL  PE: BP 118/75 mmHg  Pulse 79  Temp(Src) 98.5 F (36.9 C) (Oral)  Resp 16  Ht 5' (1.524 m)  Wt 177 lb (80.287 kg)  BMI 34.57 kg/m2  SpO2 98%  LMP 08/31/2014 (Exact Date)  Breastfeeding? Unknown Gen: calm comfortable, NAD Resp: normal effort, no distress Abd: gravid  ROS, labs, PMH reviewed  Plan: MOF: breast and bottle MOC: BTL PP, consents signed <30d ID: GBS neg FWB: cat I Labor: expectant mgmt Pain: EPIDURAL  [name] ROCIO 05/30/2015, 8:23 AM

## 2015-05-31 NOTE — Progress Notes (Signed)
Per night shift RN, patient declined CBC blood draw this morning. Notified Dorathy KinsmanVirginia Smith, CNM in person this morning. No new orders written. Patient has no complaints of feeling dizzy at this time. Will continue to monitor. Earl Galasborne, Linda HedgesStefanie ErlangerHudspeth

## 2015-05-31 NOTE — Progress Notes (Signed)
Post Partum Day 1 Subjective: no complaints, up ad lib, voiding and tolerating PO  Objective: Blood pressure 116/68, pulse 69, temperature 98.3 F (36.8 C), temperature source Oral, resp. rate 18, height 5' (1.524 m), weight 177 lb (80.287 kg), last menstrual period 08/31/2014, SpO2 97 %, unknown if currently breastfeeding.  Physical Exam:  General: alert, cooperative, appears stated age and no distress Lochia: appropriate Uterine Fundus: firm Incision: n/a - vaginal delivery DVT Evaluation: No evidence of DVT seen on physical exam. Negative Homan's sign. No cords or calf tenderness. No significant calf/ankle edema.   Recent Labs  05/30/15 0220  HGB 12.4  HCT 36.6    Assessment/Plan: Plan for discharge tomorrow, Breastfeeding, Lactation consult and Contraception desires BTL but thinking of depo first   LOS: 1 day   Erasmo DownerAngela M Bacigalupo, MD, MPH PGY-2,  [facility] 05/31/2015 7:59 AM

## 2015-05-31 NOTE — Progress Notes (Signed)
CLINICAL SOCIAL WORK MATERNAL/CHILD NOTE  Patient Details  Name: Alyssa York MRN: [id number] Date of Birth: 05/30/2013  Date:  05/31/2015  Clinical Social Worker Initiating Note:  Loleta Books, LCSW Date/ Time Initiated:  05/31/15/0945     Child's Name:  Alyssa York   Legal Guardian:  Alyssa York and Alyssa York (parents)  Need for Interpreter:  None   Date of Referral:  05/30/15     Reason for Referral:  Late or No Prenatal Care    Referral Source:  [facility]   Address:  [address] [place], [place] [address]  Phone number:  [phone]   Household Members:  Minor Children, Significant Other   Natural Supports (not living in the home):  Extended Family   Professional Supports: None   Employment: Employed  Surveyor, quantity Resources:  [employer]   Other Resources:  Sales executive , [employer]   Cultural/Religious Considerations Which May Impact Care:  None reported  Strengths:  Home prepared for child , Ability to meet basic needs    Risk Factors/Current Problems:  1) Late prenatal care: MOB initiated care at [redacted]w[redacted]d.  She stated that they "moved a lot" during the pregnancy, and established themselves in [place] in March. She stated that she applied for Medicaid, and initiated care once she had insurance.  Infant's UDS is negative and MDS is pending. 2) History of postpartum depression: MOB reported symptoms that occurred for 1-2 months after her daughter was born last year. She denied treatment since she did not know that she was experiencing postpartum depression. MOB reported high levels of stress during this pregnancy, and presents with increased risk for developing symptoms this postpartum.  Cognitive State:  Able to Concentrate , Alert , Goal Oriented , Linear Thinking    Mood/Affect:  Flat , Comfortable , Calm    CSW Assessment:  CSW received request for consult due to late prenatal care (care initiated at [redacted]w[redacted]d).  MOB, FOB, and their three  other children were in the room during the visit.  MOB presented in a pleasant mood, but displayed a limited range in affect.  MOB reported feeling tired, and expressed hope to have ability to rest later in the day when the FOB leaves the hospital with the other children for a "break".    MOB confirmed having the home prepared for the infant, but discussed having limited support in [place]. She identified the FOB as her primary support, and shared that his family is available "if needed".  MOB indicated psychosocial stressors during the pregnancy, but she declined offer to process or discuss the stressors at this time. She shared that late prenatal care was a result of "moving around" since they had been living in [place], moved to [place], moved to [place], and then returned to [place] to March.  CSW inquired about events that contributed to the moves, but she declined offer to discuss the stressors. She shared that she and the FOB intend to remain in [place], and she shared that she was able to secure employment.  MOB stated that she applied for Medicaid once she established herself in [place], and then initiated care.  MOB verbalized understanding of the hospital drug screen policy, and denied any questions or concerns related to the infant's UDS and MDS.  MOB denied any substance use during the pregnancy.   Per MOB, mental health history is significant for postpartum depression after daughter was born last year. She endorsed symptoms of depression and rage for 1-2 months postpartum.  She shared that she did not receive any treatment for symptoms since she did not know that her thoughts and feelings were symptoms of postpartum depression.  MOB acknowledged need to closely monitor her mood during upcoming weeks, and she agreed to contact her medical provider if she notes symptoms.    Due to MOB's nonverbal behaviors that indicated significant stress during the pregnancy, CSW normalized  challenges of coping with multiple aspects of life.  CSW discussed natural tendency to say that everything is "fine" while internally struggling.  MOB acknowledged CSW's statement, and agreed that she "knows what it's like" to pretend that she is able to cope well with stressors.  CSW discussed role in the hospital, and informed MOB that if MOB needs to discuss and process her thoughts and feelings, CSW is available.  MOB asked how to be in contact with CSW, number provided to MOB.  She expressed appreciation, and denied current questions, concerns, or needs.   CSW Plan/Description:   1)Patient/Family Education: Perinatal mood and anxiety disorders, hospital drug screen policy 2) CSW to monitor infant's MDS and will notify CPS if there is a positive result. 3) [facility]  4)No Further Intervention Required/No Barriers to Discharge: MOB indicated interest in follow up with CSW prior to discharge.  MOB was provided with CSW contact information, and MOB agreed to contact CSW if she would like a follow up visit.   Pervis HockingVenning, Tinaya Ceballos N, LCSW 05/31/2015, 10:19 AM

## 2015-05-31 NOTE — Lactation Note (Signed)
This note was copied from the chart of Alyssa Rubie MaidQuante Lowdermilk. Lactation Consultation Note  Patient Name: Alyssa York ZOXWR'UToday's Date: 05/31/2015 Reason for consult: Follow-up assessment (per mom plans to breast and formula , see LC note )  Baby is 7434 hours old and been consistently breast feeding and moms choice the start formula. Voids and stools adequate for age, breast feeding 10 -25 mins. And supplementing 15 -30 mins.  Per mom it will be a lot easier with 3 other little ones. ( almost 4 , 3 , 1 year olds ) . Per mom usually only breast feed  For a few weeks and then switch over. LC reviewed supply and demand. Encouraged mom to breast 1st and keep the supplementing low. Mom will need a hand pump before D/C. Due to family visiting presently not a good time to teach about the pump .      Maternal Data    Feeding Feeding Type: Bottle Fed - Formula Nipple Type: Slow - flow  LATCH Score/Interventions                      Lactation Tools Discussed/Used     Consult Status Consult Status: Follow-up Date: 06/01/15 Follow-up type: In-patient    Kathrin Greathouseorio, Kennette Cuthrell Ann 05/31/2015, 3:27 PM

## 2015-06-01 MED ORDER — IBUPROFEN 600 MG PO TABS
600.0000 mg | ORAL_TABLET | Freq: Four times a day (QID) | ORAL | Status: DC
Start: 2015-06-01 — End: 2015-08-11

## 2015-06-01 NOTE — Discharge Summary (Signed)
Obstetric Discharge Summary Reason for Admission: onset of labor Prenatal Procedures: NST Intrapartum Procedures: spontaneous vaginal delivery and VBAC Postpartum Procedures: none Complications-Operative and Postpartum: none HEMOGLOBIN  Date Value Ref Range Status  05/30/2015 12.4 12.0 - 15.0 g/dL Final   HCT  Date Value Ref Range Status  05/30/2015 36.6 36.0 - 46.0 % Final  Hospital Course:  Expand All Collapse All   Alyssa York is a 29 y.o. female G5P3013 at 5953w6d by LMP and 35wk US presenting for SOL. She reports regular, painful contractions q1-2 min x1hr. She was evaluated in AMU earlier this evening for LOF, and found to have no evidence of ROM. Desires TOLAC - has had 2 previously successful VBACs.  +FM, denies VB, LOF, vaginal discharge. Boy/br and bottle/BTL.    Delivery Note At 4:32 AM a viable female was delivered via (Presentation: Vertex; OA ). APGAR: 9, 9; weight pending.  Placenta status: spontaneous, intact. Cord: 3 vessel with the following complications: none.  Anesthesia: epidural Episiotomy: none Lacerations: none Suture Repair: none Est. Blood Loss (mL): 50  Mom to postpartum. Baby to Couplet care / Skin to Skin.  Alyssa York 05/30/2015, 4:45 AM  Has done well postpartum.  Had a successful VBAC.  Has tolerated food and activity well. Is breast and bottle feeding. Wants interval BTL.  Ready for discharge.  Physical Exam:  General: alert, cooperative and no distress Lochia: appropriate Uterine Fundus: firm Incision: healing well DVT Evaluation: No evidence of DVT seen on physical exam.  Discharge Diagnoses: Term Pregnancy-delivered  Discharge Information: Date: 06/01/2015 Activity: unrestricted and pelvic rest Diet: routine Medications: PNV and Ibuprofen Condition: stable Instructions: refer to practice specific booklet Discharge to: home   Newborn Data: Live born female  Birth Weight: 7 lb 1.4 oz (3215 g) APGAR: 9,  10  Home with mother. T/S Peds  [facility]ILL[facility],Alyssa Posten 06/01/2015, 7:36 AM

## 2015-06-01 NOTE — Progress Notes (Signed)
CSW followed up with MOB per pediatrician request due to limited access to transportation and childcare.  CSW met with MOB alone in her room. MOB expressed eagerness and readiness to be discharged home. She shared that she has already contacted DSS and inquired about Medicaid transportation. MOB shared that she has the phone number and knows that she needs to make the request 3 days prior to appointment. She stated that she has changed her pediatrician follow up appointment (to occur prior to the weekend), and she will find a family member to help her with childcare so that she can attend the appointment.  MOB denied additional barriers to accessing care.  CSW shared impressions that MOB was indicating in the assessment on 7/5 that there were ongoing stressors during the pregnancy that were impacting her mental health.  MOB agreed and reflected upon the stress she feels since she is the primary caregiver for her children and often has minimal support from family. She stated that the FOB is "great", but discussed how he is frequently gone working 12-hour shifts. Per MOB, she is not upset with him since he is providing for the family, but discussed stress since despite his family living nearby, they have never offered to help her with the children. MOB shared that she does not expect their help since "they are my children", but reported that there are times when she wished she could have brief breaks in order to relax and to have time for herself. MOB discussed goal of utilizing time when the FOB is home to care for herself, and demonstrated ability to focus on the areas of her life in which she feels gratitude. Overall, MOB presents as coping well with her interpersonal stress, and smiled as she recognized that her feelings are normal and to be anticipated given her situation.  CSW again reviewed treatment options for postpartum depression and/or anxiety, and MOB agreed to not let symptoms go untreated if she  experiences onset.  No barriers to discharge.

## 2015-06-01 NOTE — Lactation Note (Signed)
This note was copied from the chart of Alyssa York. Lactation Consultation Note  Patient Name: Alyssa York GEXBM'WToday's Date: 06/01/2015 Reason for consult: Follow-up assessment  Mom breast and bottle , 11-7 all bottles. LC reviewed sore nipple and engorgement prevention and tx. LC instructed mom on the use hand pump and cleaning.  Mother informed of post-discharge support and given phone number to the lactation department, including  services for phone call assistance; out-patient appointments; and breastfeeding support group. List of other  breastfeeding resources in the community given in the handout. Encouraged mother to call for problems or concerns related to breastfeeding.   Maternal Data    Feeding Feeding Type: Breast Fed Nipple Type: Slow - flow  LATCH Score/Interventions Latch: Grasps breast easily, tongue down, lips flanged, rhythmical sucking. Intervention(s): Adjust position;Assist with latch;Breast massage;Breast compression  Audible Swallowing: A few with stimulation Intervention(s): Skin to skin  Type of Nipple: Everted at rest and after stimulation  Comfort (Breast/Nipple): Soft / non-tender     Hold (Positioning): No assistance needed to correctly position infant at breast.  LATCH Score: 9  Lactation Tools Discussed/Used Tools: Pump Breast pump type: Manual WIC Program: Yes   Consult Status Consult Status: Complete Date: 06/01/15    Kathrin Greathouseorio, Prospero Mahnke Ann 06/01/2015, 12:49 PM

## 2015-06-01 NOTE — Discharge Instructions (Signed)

## 2015-06-09 ENCOUNTER — Encounter: Payer: Medicaid Other | Admitting: Family Medicine

## 2015-07-08 ENCOUNTER — Encounter: Payer: Self-pay | Admitting: Obstetrics & Gynecology

## 2015-07-08 ENCOUNTER — Ambulatory Visit (INDEPENDENT_AMBULATORY_CARE_PROVIDER_SITE_OTHER): Payer: Medicaid Other | Admitting: Obstetrics & Gynecology

## 2015-07-08 VITALS — BP 121/74 | HR 73 | Temp 98.4°F | Ht 60.0 in | Wt 164.6 lb

## 2015-07-08 DIAGNOSIS — Z30011 Encounter for initial prescription of contraceptive pills: Secondary | ICD-10-CM

## 2015-07-08 MED ORDER — NORGESTIM-ETH ESTRAD TRIPHASIC 0.18/0.215/0.25 MG-35 MCG PO TABS: 1.0000 | ORAL_TABLET | Freq: Every day | ORAL | Status: DC

## 2015-07-08 NOTE — Patient Instructions (Signed)
Laparoscopic Tubal Ligation Laparoscopic tubal ligation is a procedure that closes the fallopian tubes at a time other than right after childbirth. By closing the fallopian tubes, the eggs that are released from the ovaries cannot enter the uterus and sperm cannot reach the egg. Tubal ligation is also known as getting your "tubes tied." Tubal ligation is done so you will not be able to get pregnant or have a baby.  Although this procedure may be reversed, it should be considered permanent and irreversible. If you want to have future pregnancies, you should not have this procedure.  LET YOUR CAREGIVER KNOW ABOUT:  Allergies to food or medicine.  Medicines taken, including vitamins, herbs, eyedrops, over-the-counter medicines, and creams.  Use of steroids (by mouth or creams).  Previous problems with numbing medicines.  History of bleeding problems or blood clots.  Any recent colds or infections.  Previous surgery.  Other health problems, including diabetes and kidney problems.  Possibility of pregnancy, if this applies.  Any past pregnancies. RISKS AND COMPLICATIONS   Infection.  Bleeding.  Injury to surrounding organs.  Anesthetic side effects.  Failure of the procedure.  Ectopic pregnancy.  Future regret about having the procedure done. BEFORE THE PROCEDURE  Do not take aspirin or blood thinners a week before the procedure or as directed. This can cause bleeding.  Do not eat or drink anything 6 to 8 hours before the procedure. PROCEDURE   You may be given a medicine to help you relax (sedative) before the procedure. You will be given a medicine to make you sleep (general anesthetic) during the procedure.  A tube will be put down your throat to help your breath while under general anesthesia.  Two small cuts (incisions) are made in the lower abdominal area and near the belly button.  Your abdominal area will be inflated with a safe gas (carbon dioxide). This helps  give the surgeon room to operate, visualize, and helps the surgeon avoid other organs.  A thin, lighted tube (laparoscope) with a camera attached is inserted into your abdomen through one of the incisions near the belly button. Other small instruments are also inserted through the other abdominal incision.  The fallopian tubes are located and are either blocked with a ring, clip, or are burned (cauterized).  After the fallopian tubes are blocked, the gas is released from the abdomen.  The incisions will be closed with stitches (sutures), and a bandage may be placed over the incisions. AFTER THE PROCEDURE   You will rest in a recovery room for 1--4 hours until you are stable and doing well.  You will also have some mild abdominal discomfort for 3--7 days. You will be given pain medicine to ease any discomfort.  As long as there are no problems, you may be allowed to go home. Someone will need to drive you home and be with you for at least 24 hours once home.  You may have some mild discomfort in the throat. This is from the tube placed in your throat while you were sleeping.  You may experience discomfort in the shoulder area from some trapped air between the liver and diaphragm. This sensation is normal and will slowly go away on its own. Document Released: 02/18/2001 Document Revised: 05/13/2012 Document Reviewed: 02/23/2012 ExitCare Patient Information 2015 [employer]. This information is not intended to replace advice given to you by your health care provider. Make sure you discuss any questions you have with your health care provider.  

## 2015-07-08 NOTE — Progress Notes (Signed)
Patient ID: Alyssa York, female   DOB: 1986-09-20, 29 y.o.   MRN: [id number] Subjective:wants to schedule LBTL     Alyssa York is a 29 y.o. female who presents for a postpartum visit. She is 6 weeks postpartum following a spontaneous vaginal delivery. I have fully reviewed the prenatal and intrapartum course. The delivery was at 38.5 gestational weeks. Outcome: spontaneous vaginal delivery and VBAC. Anesthesia: none. Postpartum course has been good. Baby's course has been normal. Baby is feeding by bottle -  . Bleeding no bleeding. Bowel function is normal. Bladder function is normal. Patient is not sexually active. Contraception method is requests OCP until tubal ligation. Postpartum depression screening: negative.  The following portions of the patient's history were reviewed and updated as appropriate: allergies, current medications, past family history, past medical history, past social history, past surgical history and problem list.  Review of Systems Pertinent items are noted in HPI.   Objective:    BP 121/74 mmHg  Pulse 73  Temp(Src) 98.4 F (36.9 C) (Oral)  Ht 5' (1.524 m)  Wt 164 lb 9.6 oz (74.662 kg)  BMI 32.15 kg/m2  Breastfeeding? No  General:  alert, cooperative and no distress           Abdomen: soft, non-tender; bowel sounds normal; no masses,  no organomegaly   Vulva:  not evaluated  Vagina: not evaluated  Cervix:     Corpus: not examined  Adnexa:  not evaluated  Rectal Exam: Not performed.        Assessment:     normal postpartum exam. Pap smear not done at today's visit.   Plan:    1. Contraception: OCP (estrogen/progesterone) and tubal ligation 2. Schedule BTL, Medicaid form is mature 3. Follow up as needed.    Adam Phenix, MD 07/08/2015

## 2015-07-19 ENCOUNTER — Encounter (HOSPITAL_COMMUNITY): Payer: Self-pay | Admitting: *Deleted

## 2015-08-05 ENCOUNTER — Other Ambulatory Visit: Payer: Self-pay | Admitting: Obstetrics & Gynecology

## 2015-08-25 ENCOUNTER — Ambulatory Visit (HOSPITAL_COMMUNITY): Payer: Medicaid Other | Admitting: Registered Nurse

## 2015-08-25 ENCOUNTER — Ambulatory Visit (HOSPITAL_COMMUNITY)
Admission: RE | Admit: 2015-08-25 | Discharge: 2015-08-25 | Disposition: A | Discharge status: Home / Self Care | Payer: Medicaid Other | Source: Ambulatory Visit | Attending: Obstetrics & Gynecology | Admitting: Obstetrics & Gynecology

## 2015-08-25 ENCOUNTER — Encounter (HOSPITAL_COMMUNITY)
Admission: RE | Disposition: A | Discharge status: Home / Self Care | Payer: Self-pay | Source: Ambulatory Visit | Attending: Obstetrics & Gynecology

## 2015-08-25 ENCOUNTER — Encounter (HOSPITAL_COMMUNITY): Payer: Self-pay

## 2015-08-25 DIAGNOSIS — Z87891 Personal history of nicotine dependence: Secondary | ICD-10-CM | POA: Diagnosis not present

## 2015-08-25 DIAGNOSIS — Z79899 Other long term (current) drug therapy: Secondary | ICD-10-CM | POA: Insufficient documentation

## 2015-08-25 DIAGNOSIS — Z302 Encounter for sterilization: Secondary | ICD-10-CM | POA: Insufficient documentation

## 2015-08-25 HISTORY — PX: LAPAROSCOPIC TUBAL LIGATION: SHX1937

## 2015-08-25 LAB — CBC
HEMATOCRIT: 40.2 % (ref 36.0–46.0)
Hemoglobin: 13.5 g/dL (ref 12.0–15.0)
MCH: 29.3 pg (ref 26.0–34.0)
MCHC: 33.6 g/dL (ref 30.0–36.0)
MCV: 87.4 fL (ref 78.0–100.0)
Platelets: 265 10*3/uL (ref 150–400)
RBC: 4.6 MIL/uL (ref 3.87–5.11)
RDW: 14.6 % (ref 11.5–15.5)
WBC: 5.5 10*3/uL (ref 4.0–10.5)

## 2015-08-25 LAB — PREGNANCY, URINE: PREG TEST UR: NEGATIVE

## 2015-08-25 SURGERY — LIGATION, FALLOPIAN TUBE, LAPAROSCOPIC
Anesthesia: General | Site: Abdomen | Laterality: Bilateral

## 2015-08-25 MED ORDER — KETOROLAC TROMETHAMINE 30 MG/ML IJ SOLN
INTRAMUSCULAR | Status: AC
  Filled 2015-08-25: qty 1

## 2015-08-25 MED ORDER — PROPOFOL 10 MG/ML IV BOLUS
INTRAVENOUS | Status: AC
  Filled 2015-08-25: qty 20

## 2015-08-25 MED ORDER — KETOROLAC TROMETHAMINE 30 MG/ML IJ SOLN
INTRAMUSCULAR | Status: DC | PRN
  Administered 2015-08-25: 30 mg via INTRAVENOUS

## 2015-08-25 MED ORDER — DEXAMETHASONE SODIUM PHOSPHATE 4 MG/ML IJ SOLN
INTRAMUSCULAR | Status: DC | PRN
  Administered 2015-08-25: 4 mg via INTRAVENOUS

## 2015-08-25 MED ORDER — PROPOFOL 10 MG/ML IV BOLUS
INTRAVENOUS | Status: DC | PRN
  Administered 2015-08-25: 170 mg via INTRAVENOUS

## 2015-08-25 MED ORDER — DEXAMETHASONE SODIUM PHOSPHATE 4 MG/ML IJ SOLN
INTRAMUSCULAR | Status: AC
  Filled 2015-08-25: qty 1

## 2015-08-25 MED ORDER — ONDANSETRON HCL 4 MG/2ML IJ SOLN
INTRAMUSCULAR | Status: DC | PRN
  Administered 2015-08-25: 4 mg via INTRAVENOUS

## 2015-08-25 MED ORDER — SCOPOLAMINE 1 MG/3DAYS TD PT72
MEDICATED_PATCH | TRANSDERMAL | Status: AC
  Administered 2015-08-25: 1.5 mg via TRANSDERMAL
  Filled 2015-08-25: qty 1

## 2015-08-25 MED ORDER — FENTANYL CITRATE (PF) 250 MCG/5ML IJ SOLN
INTRAMUSCULAR | Status: AC
  Filled 2015-08-25: qty 25

## 2015-08-25 MED ORDER — FENTANYL CITRATE (PF) 100 MCG/2ML IJ SOLN: 25.0000 ug | INTRAMUSCULAR | Status: DC | PRN

## 2015-08-25 MED ORDER — SUCCINYLCHOLINE CHLORIDE 20 MG/ML IJ SOLN
INTRAMUSCULAR | Status: AC
  Filled 2015-08-25: qty 1

## 2015-08-25 MED ORDER — LACTATED RINGERS IV SOLN: INTRAVENOUS | Status: DC

## 2015-08-25 MED ORDER — ROCURONIUM BROMIDE 100 MG/10ML IV SOLN
INTRAVENOUS | Status: AC
  Filled 2015-08-25: qty 1

## 2015-08-25 MED ORDER — LIDOCAINE HCL (CARDIAC) 20 MG/ML IV SOLN
INTRAVENOUS | Status: AC
  Filled 2015-08-25: qty 10

## 2015-08-25 MED ORDER — LIDOCAINE HCL (CARDIAC) 20 MG/ML IV SOLN
INTRAVENOUS | Status: DC | PRN
  Administered 2015-08-25: 80 mg via INTRAVENOUS

## 2015-08-25 MED ORDER — ACETAMINOPHEN 160 MG/5ML PO SOLN: 325.0000 mg | ORAL | Status: DC | PRN

## 2015-08-25 MED ORDER — BUPIVACAINE HCL (PF) 0.25 % IJ SOLN
INTRAMUSCULAR | Status: AC
  Filled 2015-08-25: qty 30

## 2015-08-25 MED ORDER — MIDAZOLAM HCL 5 MG/5ML IJ SOLN
INTRAMUSCULAR | Status: DC | PRN
  Administered 2015-08-25: 2 mg via INTRAVENOUS

## 2015-08-25 MED ORDER — ACETAMINOPHEN 325 MG PO TABS: 325.0000 mg | ORAL_TABLET | ORAL | Status: DC | PRN

## 2015-08-25 MED ORDER — ONDANSETRON HCL 4 MG/2ML IJ SOLN
INTRAMUSCULAR | Status: AC
  Filled 2015-08-25: qty 2

## 2015-08-25 MED ORDER — LACTATED RINGERS IV SOLN
INTRAVENOUS | Status: DC
  Administered 2015-08-25 (×2): via INTRAVENOUS

## 2015-08-25 MED ORDER — LIDOCAINE HCL (CARDIAC) 20 MG/ML IV SOLN
INTRAVENOUS | Status: AC
  Filled 2015-08-25: qty 5

## 2015-08-25 MED ORDER — SCOPOLAMINE 1 MG/3DAYS TD PT72
1.0000 | MEDICATED_PATCH | Freq: Once | TRANSDERMAL | Status: DC
  Administered 2015-08-25: 1.5 mg via TRANSDERMAL

## 2015-08-25 MED ORDER — MIDAZOLAM HCL 2 MG/2ML IJ SOLN
INTRAMUSCULAR | Status: AC
  Filled 2015-08-25: qty 4

## 2015-08-25 MED ORDER — BUPIVACAINE HCL (PF) 0.25 % IJ SOLN
INTRAMUSCULAR | Status: DC | PRN
Start: 2015-08-25 — End: 2015-08-25
  Administered 2015-08-25: 15 mL

## 2015-08-25 MED ORDER — OXYCODONE HCL 5 MG/5ML PO SOLN: 5.0000 mg | Freq: Once | ORAL | Status: DC | PRN

## 2015-08-25 MED ORDER — SUCCINYLCHOLINE CHLORIDE 20 MG/ML IJ SOLN
INTRAMUSCULAR | Status: DC | PRN
  Administered 2015-08-25: 120 mg via INTRAVENOUS

## 2015-08-25 MED ORDER — OXYCODONE HCL 5 MG PO TABS: 5.0000 mg | ORAL_TABLET | Freq: Once | ORAL | Status: DC | PRN

## 2015-08-25 MED ORDER — FENTANYL CITRATE (PF) 100 MCG/2ML IJ SOLN
INTRAMUSCULAR | Status: DC | PRN
  Administered 2015-08-25: 150 ug via INTRAVENOUS
  Administered 2015-08-25 (×2): 50 ug via INTRAVENOUS

## 2015-08-25 MED ORDER — OXYCODONE-ACETAMINOPHEN 5-325 MG PO TABS: 1.0000 | ORAL_TABLET | Freq: Four times a day (QID) | ORAL | Status: AC | PRN

## 2015-08-25 SURGICAL SUPPLY — 23 items
BENZOIN TINCTURE PRP APPL 2/3 (GAUZE/BANDAGES/DRESSINGS) IMPLANT
CATH ROBINSON RED A/P 16FR (CATHETERS) ×3 IMPLANT
CHLORAPREP W/TINT 26ML (MISCELLANEOUS) ×3 IMPLANT
CLIP FILSHIE TUBAL LIGA STRL (Clip) ×6 IMPLANT
CLOTH BEACON ORANGE TIMEOUT ST (SAFETY) ×3 IMPLANT
DRSG COVADERM PLUS 2X2 (GAUZE/BANDAGES/DRESSINGS) ×6 IMPLANT
DRSG OPSITE POSTOP 3X4 (GAUZE/BANDAGES/DRESSINGS) ×3 IMPLANT
GLOVE BIO SURGEON STRL SZ 6.5 (GLOVE) ×2 IMPLANT
GLOVE BIO SURGEONS STRL SZ 6.5 (GLOVE) ×1
GLOVE BIOGEL PI IND STRL 7.0 (GLOVE) ×1 IMPLANT
GLOVE BIOGEL PI INDICATOR 7.0 (GLOVE) ×2
GOWN STRL REUS W/TWL LRG LVL3 (GOWN DISPOSABLE) ×6 IMPLANT
LIQUID BAND (GAUZE/BANDAGES/DRESSINGS) ×3 IMPLANT
NEEDLE INSUFFLATION 120MM (ENDOMECHANICALS) ×3 IMPLANT
PACK LAPAROSCOPY BASIN (CUSTOM PROCEDURE TRAY) ×3 IMPLANT
PAD POSITIONING PINK XL (MISCELLANEOUS) ×3 IMPLANT
SET IRRIG TUBING LAPAROSCOPIC (IRRIGATION / IRRIGATOR) IMPLANT
SUT VICRYL 0 UR6 27IN ABS (SUTURE) ×3 IMPLANT
SUT VICRYL 4-0 PS2 18IN ABS (SUTURE) ×3 IMPLANT
TOWEL OR 17X24 6PK STRL BLUE (TOWEL DISPOSABLE) ×6 IMPLANT
TROCAR XCEL DIL TIP R 11M (ENDOMECHANICALS) ×3 IMPLANT
WARMER LAPAROSCOPE (MISCELLANEOUS) ×3 IMPLANT
WATER STERILE IRR 1000ML POUR (IV SOLUTION) ×3 IMPLANT

## 2015-08-25 NOTE — Anesthesia Procedure Notes (Signed)
Procedure Name: Intubation Date/Time: 08/25/2015 1:13 PM Performed by: Jhonnie Garner Pre-anesthesia Checklist: Patient identified, Emergency Drugs available, Suction available and Patient being monitored Patient Re-evaluated:Patient Re-evaluated prior to inductionOxygen Delivery Method: Circle system utilized Preoxygenation: Pre-oxygenation with 100% oxygen Intubation Type: IV induction Ventilation: Mask ventilation without difficulty Laryngoscope Size: Miller and 2 Grade View: Grade I Tube type: Oral Tube size: 7.0 mm Number of attempts: 1 Airway Equipment and Method: Stylet Placement Confirmation: ETT inserted through vocal cords under direct vision,  positive ETCO2 and breath sounds checked- equal and bilateral Secured at: 20 cm Tube secured with: Tape Dental Injury: Teeth and Oropharynx as per pre-operative assessment

## 2015-08-25 NOTE — Anesthesia Preprocedure Evaluation (Addendum)
Anesthesia Evaluation  Patient identified by MRN, date of birth, ID band Patient awake    Reviewed: Allergy & Precautions, NPO status , Patient's Chart, lab work & pertinent test results, reviewed documented beta blocker date and time   History of Anesthesia Complications Negative for: history of anesthetic complications  Airway Mallampati: II  TM Distance: >3 FB Neck ROM: Full    Dental  (+) Teeth Intact   Pulmonary Current Smoker,    breath sounds clear to auscultation       Cardiovascular negative cardio ROS   Rhythm:Regular     Neuro/Psych negative neurological ROS  negative psych ROS   GI/Hepatic negative GI ROS, Neg liver ROS,   Endo/Other  negative endocrine ROS  Renal/GU negative Renal ROS     Musculoskeletal   Abdominal   Peds  Hematology negative hematology ROS (+)   Anesthesia Other Findings   Reproductive/Obstetrics                           Anesthesia Physical Anesthesia Plan  ASA: II  Anesthesia Plan: General   Post-op Pain Management:    Induction: Intravenous  Airway Management Planned: Oral ETT  Additional Equipment: None  Intra-op Plan:   Post-operative Plan: Extubation in OR  Informed Consent: I have reviewed the patients History and Physical, chart, labs and discussed the procedure including the risks, benefits and alternatives for the proposed anesthesia with the patient or authorized representative who has indicated his/her understanding and acceptance.   Dental advisory given  Plan Discussed with: CRNA and Surgeon  Anesthesia Plan Comments:        Anesthesia Quick Evaluation

## 2015-08-25 NOTE — Transfer of Care (Signed)
Immediate Anesthesia Transfer of Care Note  Patient: Alyssa York  Procedure(s) Performed: Procedure(s) with comments: LAPAROSCOPIC TUBAL LIGATION (Bilateral) - Requested 08-25-15 @ 1:00p  Patient Location: PACU  Anesthesia Type:General  Level of Consciousness: awake, alert  and oriented  Airway & Oxygen Therapy: Patient Spontanous Breathing and Patient connected to nasal cannula oxygen  Post-op Assessment: Report given to RN  Post vital signs: Reviewed  Last Vitals:  Filed Vitals:   08/25/15 1136  BP: 97/71  Pulse: 69  Temp: 36.9 C  Resp: 18    Complications: No apparent anesthesia complications

## 2015-08-25 NOTE — Op Note (Signed)
Alyssa York 08/25/2015  PREOPERATIVE DIAGNOSIS:  Undesired fertility  POSTOPERATIVE DIAGNOSIS:  Undesired fertility  PROCEDURE:  Laparoscopic Bilateral Tubal Sterilization using Filshie Clips   SURGEON: Adam Phenix, MD   ANESTHESIA:  General endotracheal  COMPLICATIONS:  None immediate.  ESTIMATED BLOOD LOSS:  Less than 20 ml.  FLUIDS: 1000 ml LR.  URINE OUTPUT:  250 ml of clear urine.  INDICATIONS: 29 y.o. [id number]  with undesired fertility, desires permanent sterilization. Other reversible forms of contraception were discussed with patient; she declines all other modalities.  Risks of procedure discussed with patient including permanence of method, bleeding, infection, injury to surrounding organs and need for additional procedures including laparotomy, risk of regret.  Failure risk of 0.5-1% with increased risk of ectopic gestation if pregnancy occurs was also discussed with patient.      FINDINGS:  Normal uterus, tubes, and ovaries.  TECHNIQUE:  The patient was taken to the operating room where general anesthesia was obtained without difficulty.  She was then placed in the dorsal lithotomy position and prepared and draped in sterile fashion.  After an adequate timeout was performed, a bivalved speculum was then placed in the patient's vagina, and the anterior lip of cervix grasped with the single-tooth tenaculum.  The uterine manipulator was then advanced into the uterus.  The speculum was removed from the vagina.  Attention was then turned to the patient's abdomen where a 11-mm vertical skin incision was made in the umbilical fold.  After insufflation with the Veress needle the 11-mm trocar and sleeve were then advanced without difficulty into the abdomen.  A survey of the patient's pelvis and abdomen revealed entirely normal anatomy.  The fallopian tubes were observed and found to be normal in appearance. The Filshie clip applicator was placed through the operative port, and a  Filshie clip was placed on the right fallopian tube ,about 2 cm from the cornual attachment, with care given to incorporate the underlying mesosalpinx.  A similar process was carried out on the contralateral side allowing for bilateral tubal sterilization.   Good hemostasis was noted overall.  Local analgesia was drizzled on both operative sites.The instruments were then removed from the patient's abdomen and the fascial incision was repaired with 0 Vicryl, and the skin was closed with Dermabond.  The uterine manipulator and the tenaculum were removed from the vagina without complications. The patient tolerated the procedure well.  Sponge, lap, and needle counts were correct times two.  The patient was then taken to the recovery room awake, extubated and in stable condition.  Adam Phenix, MD 08/25/2015 Alyssa York:02 PM

## 2015-08-25 NOTE — Anesthesia Postprocedure Evaluation (Signed)
  Anesthesia Post-op Note  Patient: Alyssa York  Procedure(s) Performed: Procedure(s) with comments: LAPAROSCOPIC TUBAL LIGATION (Bilateral) - Requested 08-25-15 @ 1:00p  Patient Location: PACU  Anesthesia Type:General  Level of Consciousness: awake  Airway and Oxygen Therapy: Patient Spontanous Breathing  Post-op Pain: none  Post-op Assessment: Post-op Vital signs reviewed, Patient's Cardiovascular Status Stable, Respiratory Function Stable, Patent Airway, No signs of Nausea or vomiting and Pain level controlled              Post-op Vital Signs: Reviewed and stable  Last Vitals:  Filed Vitals:   08/25/15 1445  BP: 110/89  Pulse: 64  Temp:   Resp: 18    Complications: No apparent anesthesia complications

## 2015-08-25 NOTE — Discharge Instructions (Signed)
Laparoscopic Tubal Ligation °Care After °Refer to this sheet in the next few weeks. These instructions provide you with information on caring for yourself after your procedure. Your caregiver may also give you more specific instructions. Your treatment has been planned according to current medical practices, but problems sometimes occur. Call your caregiver if you have any problems or questions after your procedure. °HOME CARE INSTRUCTIONS  °· Rest the remainder of the day. °· Only take over-the-counter or prescription medicines for pain, discomfort, or fever as directed by your caregiver. Do not take aspirin. It can cause bleeding. °· Gradually resume daily activities, diet, rest, driving, and work. °· Avoid sexual intercourse for 2 weeks or as directed. °· Do not use tampons or douche. °· Do not drive while taking pain medicine. °· Do not lift anything over 5 pounds for 2 weeks or as directed. °· Only take showers, not baths, until you are seen by your caregiver. °· Change bandages (dressings) as directed. °· Take your temperature twice a day and record it. °· Try to have help for the first 7 to 10 days for your household needs. °· Return to your caregiver to get your stitches (sutures) removed and for follow-up visits as directed. °SEEK MEDICAL CARE IF:  °· You have redness, swelling, or increasing pain in a wound. °· You have drainage from a wound lasting longer than 1 day. °· Your pain is getting worse. °· You have a rash. °· You become dizzy or lightheaded. °· You have a reaction to your medicine. °· You need stronger medicine or a change in your pain medicine. °· You notice a bad smell coming from a wound or dressing. °· Your wound breaks open after the sutures have been removed. °· You are constipated. °SEEK IMMEDIATE MEDICAL CARE IF:  °· You faint. °· You have a fever. °· You have increasing abdominal pain. °· You have severe pain in your shoulders. °· You have bleeding or drainage from the suture sites or  vagina following surgery. °· You have shortness of breath or difficulty breathing. °· You have chest or leg pain. °· You have persistent nausea, vomiting, or diarrhea. °MAKE SURE YOU:  °· Understand these instructions. °· Watch your condition. °· Get help right away if you are not doing well or get worse. °Document Released: 06/01/2005 Document Revised: 05/13/2012 Document Reviewed: 02/23/2012 °[employer]® Patient Information ©2015 [employer]. This information is not intended to replace advice given to you by your health care provider. Make sure you discuss any questions you have with your health care provider. °DISCHARGE INSTRUCTIONS: Laparoscopy ° °The following instructions have been prepared to help you care for yourself upon your return home today. ° °Wound care: °• Do not get the incision wet for the first 24 hours. The incision should be kept clean and dry. °• The Band-Aids or dressings may be removed the day after surgery. °• Should the incision become sore, red, and swollen after the first week, check with your doctor. ° °Personal hygiene: °• Shower the day after your procedure. ° °Activity and limitations: °• Do NOT drive or operate any equipment today. °• Do NOT lift anything more than 15 pounds for 2-3 weeks after surgery. °• Do NOT rest in bed all day. °• Walking is encouraged. Walk each day, starting slowly with 5-minute walks 3 or 4 times a day. Slowly increase the length of your walks. °• Walk up and down stairs slowly. °• Do NOT do strenuous activities, such as golfing, playing tennis,   bowling, running, biking, weight lifting, gardening, mowing, or vacuuming for 2-4 weeks. Ask your doctor when it is okay to start.  Diet: Eat a light meal as desired this evening. You may resume your usual diet tomorrow.  Return to work: This is dependent on the type of work you do. For the most part you can return to a desk job within a week of surgery. If you are more active at work, please discuss this with  your doctor.  What to expect after your surgery: You may have a slight burning sensation when you urinate on the first day. You may have a very small amount of blood in the urine. Expect to have a small amount of vaginal discharge/light bleeding for 1-2 weeks. It is not unusual to have abdominal soreness and bruising for up to 2 weeks. You may be tired and need more rest for about 1 week. You may experience shoulder pain for 24-72 hours. Lying flat in bed may relieve it.  Call your doctor for any of the following:  Develop a fever of 100.4 or greater  Inability to urinate 6 hours after discharge from hospital  Severe pain not relieved by pain medications  Persistent of heavy bleeding at incision site  Redness or swelling around incision site after a week  Increasing nausea or vomiting  Patient Signature________________________________________ Nurse Signature_________________________________________

## 2015-08-25 NOTE — H&P (Signed)
Alyssa York is an 29 y.o. female. [id number] Patient's last menstrual period was 07/30/2015. Scheduled for LBTL today, on OCP currently.  Last pap: normal Date: 04/2015   Menstrual History:  Patient's last menstrual period was 07/30/2015.    Past Medical History  Diagnosis Date  . Medical history non-contributory     Past Surgical History  Procedure Laterality Date  . Cesarean section      History reviewed. No pertinent family history.  Social History:  reports that she quit smoking about 5 years ago. She has never used smokeless tobacco. She reports that she does not drink alcohol or use illicit drugs.  Allergies: No Known Allergies  Prescriptions prior to admission  Medication Sig Dispense Refill Last Dose  . Norgestimate-Ethinyl Estradiol Triphasic (TRI-SPRINTEC) 0.18/0.215/0.25 MG-35 MCG tablet Take 1 tablet by mouth daily. 1 Package 11     Review of Systems  Constitutional: Negative.   Respiratory: Negative.   Cardiovascular: Negative.   Genitourinary: Negative.     Blood pressure 97/71, pulse 69, temperature 98.4 F (36.9 C), temperature source Oral, resp. rate 18, height 5' (1.524 m), weight 164 lb (74.39 kg), last menstrual period 07/30/2015, SpO2 98 %, not currently breastfeeding. Physical Exam  Constitutional: She is oriented to person, place, and time. She appears well-developed. No distress.  HENT:  Head: Normocephalic.  Neck: Normal range of motion.  Cardiovascular: Normal rate.   Respiratory: Effort normal. No respiratory distress.  GI: Soft. She exhibits no distension.  Musculoskeletal: Normal range of motion.  Neurological: She is alert and oriented to person, place, and time.  Skin: Skin is warm and dry.  Psychiatric: She has a normal mood and affect. Her behavior is normal.    Results for orders placed or performed during the hospital encounter of 08/25/15 (from the past 24 hour(s))  CBC     Status: None   Collection Time: 08/25/15 11:15 AM   Result Value Ref Range   WBC 5.5 4.0 - 10.5 K/uL   RBC 4.60 3.87 - 5.11 MIL/uL   Hemoglobin 13.5 12.0 - 15.0 g/dL   HCT 30.8 65.7 - 84.6 %   MCV 87.4 78.0 - 100.0 fL   MCH 29.3 26.0 - 34.0 pg   MCHC 33.6 30.0 - 36.0 g/dL   RDW 96.2 95.2 - 84.1 %   Platelets 265 150 - 400 K/uL  Pregnancy, urine     Status: None   Collection Time: 08/25/15 11:30 AM  Result Value Ref Range   Preg Test, Ur NEGATIVE NEGATIVE    No results found.  Assessment/Plan: Desires sterilization, BTL today. The procedure and the risk of anesthesia, bleeding, infection, bowel and bladder injury, failure (1/200) and ectopic pregnancy were discussed and her questions were answered.    ARNOLD,JAMES 08/25/2015, 12:55 PM

## 2015-08-27 ENCOUNTER — Encounter (HOSPITAL_COMMUNITY): Payer: Self-pay | Admitting: Obstetrics & Gynecology

## 2015-10-12 ENCOUNTER — Encounter (HOSPITAL_COMMUNITY): Payer: Self-pay | Admitting: *Deleted

## 2015-10-12 ENCOUNTER — Emergency Department (INDEPENDENT_AMBULATORY_CARE_PROVIDER_SITE_OTHER)
Admission: EM | Admit: 2015-10-12 | Discharge: 2015-10-12 | Disposition: A | Discharge status: Home / Self Care | Payer: 59 | Source: Home / Self Care | Attending: Family Medicine | Admitting: Family Medicine

## 2015-10-12 DIAGNOSIS — N390 Urinary tract infection, site not specified: Secondary | ICD-10-CM

## 2015-10-12 LAB — POCT URINALYSIS DIP (DEVICE)
Bilirubin Urine: NEGATIVE
Glucose, UA: NEGATIVE mg/dL
KETONES UR: NEGATIVE mg/dL
Nitrite: NEGATIVE
PH: 7 (ref 5.0–8.0)
PROTEIN: NEGATIVE mg/dL
Specific Gravity, Urine: 1.02 (ref 1.005–1.030)
UROBILINOGEN UA: 0.2 mg/dL (ref 0.0–1.0)

## 2015-10-12 LAB — POCT PREGNANCY, URINE: PREG TEST UR: NEGATIVE

## 2015-10-12 MED ORDER — CEPHALEXIN 500 MG PO CAPS: 500.0000 mg | ORAL_CAPSULE | Freq: Four times a day (QID) | ORAL | Status: AC

## 2015-10-12 NOTE — ED Provider Notes (Signed)
CSN: [id number]     Arrival date & time 10/12/15  1409 History   First MD Initiated Contact with Patient 10/12/15 1541     Chief Complaint  Patient presents with  . Cystitis   (Consider location/radiation/quality/duration/timing/severity/associated sxs/prior Treatment) Patient is a 29 y.o. female presenting with abdominal pain. The history is provided by the patient.  Abdominal Pain Pain location:  Suprapubic Pain quality: burning   Pain radiates to:  L flank, R flank and suprapubic region Pain severity:  Mild Onset quality:  Gradual Duration:  4 days Progression:  Unchanged Chronicity:  New Associated symptoms: dysuria   Associated symptoms: no constipation, no diarrhea, no fever, no nausea and no vomiting     Past Medical History  Diagnosis Date  . Medical history non-contributory    Past Surgical History  Procedure Laterality Date  . Cesarean section    . Laparoscopic tubal ligation Bilateral 08/25/2015    Procedure: LAPAROSCOPIC TUBAL LIGATION;  Surgeon: Adam Phenix, MD;  Location: [facility];  Service: Gynecology;  Laterality: Bilateral;  Requested 08-25-15 @ 1:00p   History reviewed. No pertinent family history. Social History  Substance Use Topics  . Smoking status: Former Smoker    Quit date: 03/11/2010  . Smokeless tobacco: Never Used  . Alcohol Use: No   OB History    Gravida Para Term Preterm AB TAB SAB Ectopic Multiple Living   0 0 0 0 0 0 4     Review of Systems  Constitutional: Negative for fever.  Gastrointestinal: Positive for abdominal pain. Negative for nausea, vomiting, diarrhea and constipation.  Genitourinary: Positive for dysuria, urgency and frequency. Negative for flank pain, menstrual problem and pelvic pain.  All other systems reviewed and are negative.   Allergies  Review of patient's allergies indicates no known allergies.  Home Medications   Prior to Admission medications   Medication Sig Start Date End Date Taking?  Authorizing Provider  cephALEXin (KEFLEX) 500 MG capsule Take 1 capsule (500 mg total) by mouth 4 (four) times daily. Take all of medicine and drink lots of fluids 10/12/15   Linna Hoff, MD  oxyCODONE-acetaminophen (PERCOCET/ROXICET) 5-325 MG tablet Take 1-2 tablets by mouth every 6 (six) hours as needed. 08/25/15   Adam Phenix, MD   Meds Ordered and Administered this Visit  Medications - No data to display  BP 176/80 mmHg  Pulse 61  Temp(Src) 98.4 F (36.9 C) (Oral)  Resp 16  SpO2 96%  LMP 09/28/2015 No data found.   Physical Exam  Constitutional: She is oriented to person, place, and time. She appears well-developed and well-nourished. No distress.  Abdominal: Soft. Bowel sounds are normal. She exhibits no distension and no mass. There is no tenderness. There is no rebound and no guarding.  Neurological: She is alert and oriented to person, place, and time.  Skin: Skin is warm and dry.  Nursing note and vitals reviewed.   ED Course  Procedures (including critical care time)  Labs Review Labs Reviewed  POCT URINALYSIS DIP (DEVICE) - Abnormal; Notable for the following:    Hgb urine dipstick TRACE (*)    Leukocytes, UA LARGE (*)    All other components within normal limits  POCT PREGNANCY, URINE    Imaging Review No results found.   Visual Acuity Review  Right Eye Distance:   Left Eye Distance:   Bilateral Distance:    Right Eye Near:   Left Eye Near:    Bilateral Near:  MDM   1. UTI (lower urinary tract infection)    rx for keflex for uti.    Linna HoffJames D Yuma Blucher, MD 10/12/15 [phone]

## 2015-10-12 NOTE — Discharge Instructions (Signed)
Take all of medicine as directed, drink lots of fluids, see your doctor if further problems. °

## 2015-10-12 NOTE — ED Notes (Signed)
Pt  Reports    s  Symptoms  Of  r  Side  Back  And  Low  abd  Pain  With  hesistancy /  Burning        On  Urination            Symptoms  X  4-5  Days

## 2015-10-26 ENCOUNTER — Encounter (HOSPITAL_COMMUNITY): Payer: Self-pay | Admitting: *Deleted

## 2015-11-14 ENCOUNTER — Other Ambulatory Visit: Payer: Self-pay | Admitting: Occupational Medicine

## 2015-11-14 ENCOUNTER — Ambulatory Visit: Payer: Self-pay

## 2015-11-14 DIAGNOSIS — M79644 Pain in right finger(s): Secondary | ICD-10-CM

## 2016-01-17 ENCOUNTER — Encounter: Payer: Self-pay | Admitting: *Deleted

## 2018-01-27 ENCOUNTER — Encounter: Payer: Self-pay | Admitting: *Deleted
